# Patient Record
Sex: Female | Born: 1980 | Race: White | Hispanic: No | Smoking: Former smoker
Health system: Southern US, Community
[De-identification: ages and names within clinical notes are randomized; demographics above are authoritative.]

## PROBLEM LIST (undated history)

## (undated) DIAGNOSIS — IMO0002 Reserved for concepts with insufficient information to code with codable children: Secondary | ICD-10-CM

## (undated) DIAGNOSIS — M329 Systemic lupus erythematosus, unspecified: Secondary | ICD-10-CM

## (undated) HISTORY — PX: LUNG SURGERY: SHX703

## (undated) HISTORY — PX: OTHER SURGICAL HISTORY: SHX169

## (undated) HISTORY — DX: Systemic lupus erythematosus, unspecified: M32.9

## (undated) HISTORY — DX: Reserved for concepts with insufficient information to code with codable children: IMO0002

---

## 2003-02-11 ENCOUNTER — Inpatient Hospital Stay (HOSPITAL_COMMUNITY): Admission: AD | Admit: 2003-02-11 | Discharge: 2003-02-12 | Payer: Self-pay | Admitting: Obstetrics & Gynecology

## 2010-06-29 NOTE — Op Note (Signed)
Brittney White, Brittney White                              ACCOUNT NO.:  [id number]   MEDICAL RECORD NO.:  [id number]                   PATIENT TYPE:  INP   LOCATION:  LDR2                                 FACILITY:  [facility]   PHYSICIAN:  Lazaro Arms, M.D.                DATE OF BIRTH:  10-25-1980   DATE OF PROCEDURE:  02/11/2003  DATE OF DISCHARGE:                                 OPERATIVE REPORT   Mashonda progressed rapidly with just minimal Pitocin augmentation and developed  an urge to push at approximately 1320.  After a very brief second stage in  which the baby rotated in crowning position from ROP to OA position, she  delivered a viable female infant at 72.  Two loose nuchal cords were  reduced, and then the shoulders and body delivered without difficulty.  The  mouth and nose were suctioned on the perineum.  Apgars are 9 and 9, weight 6  pounds, 12 ounces.  Pitocin 20 units diluted in 1000 mL of lactated Ringer's  was infused rapidly IV.  The placenta separated spontaneously and delivered  by a controlled cord traction at 1331.  It was inspected and appears to be  intact with a three-vessel cord.  The fundus was immediately firm with  minimal blood loss noted.  The vagina was then inspected, and no lacerations  were found.  Estimated blood loss 300 mL.     ________________________________________  ___________________________________________  Jacklyn Shell, C.N.M.           Lazaro Arms, M.D.   FC/MEDQ  D:  02/11/2003  T:  02/11/2003  Job:  [id number]

## 2010-06-29 NOTE — H&P (Signed)
NAMEDANE, BLOCH                              ACCOUNT NO.:  [id number]   MEDICAL RECORD NO.:  [id number]                   PATIENT TYPE:  INP   LOCATION:  LDR2                                 FACILITY:  [facility]   PHYSICIAN:  Lazaro Arms, M.D.                DATE OF BIRTH:  04/01/1980   DATE OF ADMISSION:  02/11/2003  DATE OF DISCHARGE:                                HISTORY & PHYSICAL   CHIEF COMPLAINT:  Labor contractions since yesterday, becoming more regular  and intense today.   HISTORY OF PRESENT ILLNESS:  Keviana is a 30 year old gravida 3, para 1, Ab1,  with an EDC of February 21, 2003, based on a 20-week ultrasound.  Although  she states her last menstrual period was sure, the ultrasound was more than  a month off, so we assigned her an [facility] of February 21, 2003, placing her at  38-1/2 weeks' gestation.  She began prenatal care in her 24th week and has  had regular visits throughout.  Her prenatal course has been basically  uneventful.   Prenatal laboratories:  Blood type -- A positive, rubella is immune.  HBsAg,  HIV, RPR, gonorrhea, Chlamydia, GBS are all negative.  She had a one-hour  GTT that was normal at 71.   Blood pressures have been 120s to 140s over 60s to 80s.  She has had  appropriate fundal height growth with a drop in fundal height growth noted  the other day, but the patient that the baby had gotten much lower in her  pelvis, so that could probably account for the difference.   PAST MEDICAL HISTORY:  Noncontributory.   PAST SURGICAL HISTORY:  Past surgical history positive for tubes in her  ears, tonsillectomy and leg surgery.   ALLERGIES:  No known drug allergies.   SOCIAL HISTORY:  Is married, a housewife.   OBSTETRICAL HISTORY:  A second trimester miscarriage in 1995 and a  spontaneous vaginal delivery in the year 2000 at 40 weeks of a 7-pound 8-  ounce female with about a 45-minute labor.   PHYSICAL EXAMINATION:  HEENT:  Within normal limits.  LUNGS:   Lungs are clear.  ABDOMEN:  Abdomen soft, nontender, having mild-to-moderate contractions  every 3 minutes.  Fetal heart rate is in the 130s, reactive without  decelerations.  PELVIC:  Cervical exam upon admission was 4 cm, 100%, minus 1 station.  Cervical exam approximately 45 minutes to an hour later was 6 cm, 100%,  minus 1 to 0 station.  Artificial rupture of membranes revealed clear fluid.  EXTREMITIES:  Legs are negative.   IMPRESSION:  Intrauterine pregnancy at 38-1/2 weeks, active labor,  tolerating very well.   PLAN:  Expectant management.  Anticipate vaginal delivery, estimated fetal  weight about 7 pounds.     _____________________________________  ___________________________________________  Jacklyn Shell, C.N.M.        Amaryllis Dyke.  Despina Hidden, M.D.   FC/MEDQ  D:  02/11/2003  T:  02/11/2003  Job:  [id number]   cc:   [facility]

## 2013-08-03 ENCOUNTER — Encounter (HOSPITAL_COMMUNITY): Payer: Self-pay | Admitting: Emergency Medicine

## 2013-08-03 ENCOUNTER — Emergency Department (HOSPITAL_COMMUNITY)
Admission: EM | Admit: 2013-08-03 | Discharge: 2013-08-03 | Disposition: A | Discharge status: Home / Self Care | Payer: Medicaid Other | Attending: Emergency Medicine | Admitting: Emergency Medicine

## 2013-08-03 ENCOUNTER — Emergency Department (HOSPITAL_COMMUNITY): Payer: Medicaid Other

## 2013-08-03 DIAGNOSIS — Z3202 Encounter for pregnancy test, result negative: Secondary | ICD-10-CM | POA: Insufficient documentation

## 2013-08-03 DIAGNOSIS — N390 Urinary tract infection, site not specified: Secondary | ICD-10-CM | POA: Insufficient documentation

## 2013-08-03 DIAGNOSIS — R109 Unspecified abdominal pain: Secondary | ICD-10-CM | POA: Insufficient documentation

## 2013-08-03 DIAGNOSIS — F17200 Nicotine dependence, unspecified, uncomplicated: Secondary | ICD-10-CM | POA: Insufficient documentation

## 2013-08-03 DIAGNOSIS — R1030 Lower abdominal pain, unspecified: Secondary | ICD-10-CM

## 2013-08-03 LAB — URINALYSIS, ROUTINE W REFLEX MICROSCOPIC
Bilirubin Urine: NEGATIVE
Glucose, UA: NEGATIVE mg/dL
Ketones, ur: NEGATIVE mg/dL
Nitrite: NEGATIVE
Protein, ur: NEGATIVE mg/dL
Specific Gravity, Urine: 1.025 (ref 1.005–1.030)
Urobilinogen, UA: 0.2 mg/dL (ref 0.0–1.0)
pH: 6 (ref 5.0–8.0)

## 2013-08-03 LAB — BASIC METABOLIC PANEL
BUN: 13 mg/dL (ref 6–23)
CO2: 27 mEq/L (ref 19–32)
Calcium: 10 mg/dL (ref 8.4–10.5)
Chloride: 106 mEq/L (ref 96–112)
Creatinine, Ser: 0.79 mg/dL (ref 0.50–1.10)
GFR calc Af Amer: >90 mL/min (ref >90)
GFR calc non Af Amer: >90 mL/min (ref >90)
Glucose, Bld: 91 mg/dL (ref 70–99)
Potassium: 4.1 mEq/L (ref 3.7–5.3)
Sodium: 143 mEq/L (ref 137–147)

## 2013-08-03 LAB — CBC WITH DIFFERENTIAL/PLATELET
Basophils Absolute: 0 10*3/uL (ref 0.0–0.1)
Basophils Relative: 0 % (ref 0–1)
Eosinophils Absolute: 0.1 10*3/uL (ref 0.0–0.7)
Eosinophils Relative: 1 % (ref 0–5)
HCT: 40.7 % (ref 36.0–46.0)
Hemoglobin: 13.8 g/dL (ref 12.0–15.0)
Lymphocytes Relative: 30 % (ref 12–46)
Lymphs Abs: 4.1 10*3/uL — ABNORMAL HIGH (ref 0.7–4.0)
MCH: 30.3 pg (ref 26.0–34.0)
MCHC: 33.9 g/dL (ref 30.0–36.0)
MCV: 89.3 fL (ref 78.0–100.0)
Monocytes Absolute: 0.7 10*3/uL (ref 0.1–1.0)
Monocytes Relative: 5 % (ref 3–12)
Neutro Abs: 8.7 10*3/uL — ABNORMAL HIGH (ref 1.7–7.7)
Neutrophils Relative %: 64 % (ref 43–77)
Platelets: 252 10*3/uL (ref 150–400)
RBC: 4.56 MIL/uL (ref 3.87–5.11)
RDW: 14.5 % (ref 11.5–15.5)
WBC: 13.7 10*3/uL — ABNORMAL HIGH (ref 4.0–10.5)

## 2013-08-03 LAB — URINE MICROSCOPIC-ADD ON

## 2013-08-03 LAB — PREGNANCY, URINE: Preg Test, Ur: NEGATIVE

## 2013-08-03 MED ORDER — KETOROLAC TROMETHAMINE 30 MG/ML IJ SOLN
15.0000 mg | Freq: Once | INTRAMUSCULAR | Status: AC
  Administered 2013-08-03: 15 mg via INTRAVENOUS

## 2013-08-03 MED ORDER — DEXTROSE 5 % IV SOLN
1.0000 g | Freq: Once | INTRAVENOUS | Status: AC
  Administered 2013-08-03: 1 g via INTRAVENOUS

## 2013-08-03 MED ORDER — IOHEXOL 300 MG/ML  SOLN
50.0000 mL | Freq: Once | INTRAMUSCULAR | Status: AC | PRN
  Administered 2013-08-03: 50 mL via ORAL

## 2013-08-03 MED ORDER — SODIUM CHLORIDE 0.9 % IV BOLUS (SEPSIS)
1000.0000 mL | Freq: Once | INTRAVENOUS | Status: AC
  Administered 2013-08-03: 1000 mL via INTRAVENOUS

## 2013-08-03 MED ORDER — ONDANSETRON HCL 4 MG/2ML IJ SOLN
4.0000 mg | Freq: Once | INTRAMUSCULAR | Status: AC
  Administered 2013-08-03: 4 mg via INTRAVENOUS

## 2013-08-03 MED ORDER — ONDANSETRON HCL 4 MG/2ML IJ SOLN
INTRAMUSCULAR | Status: AC
  Filled 2013-08-03: qty 2

## 2013-08-03 MED ORDER — DEXTROSE 5 % IV SOLN
INTRAVENOUS | Status: AC
  Filled 2013-08-03: qty 10

## 2013-08-03 MED ORDER — OXYCODONE-ACETAMINOPHEN 5-325 MG PO TABS: 1.0000 | ORAL_TABLET | ORAL | Status: DC | PRN

## 2013-08-03 MED ORDER — METRONIDAZOLE 500 MG PO TABS: 500.0000 mg | ORAL_TABLET | Freq: Two times a day (BID) | ORAL | Status: DC

## 2013-08-03 MED ORDER — KETOROLAC TROMETHAMINE 30 MG/ML IJ SOLN
INTRAMUSCULAR | Status: AC
  Filled 2013-08-03: qty 1

## 2013-08-03 MED ORDER — CEPHALEXIN 500 MG PO CAPS: 500.0000 mg | ORAL_CAPSULE | Freq: Four times a day (QID) | ORAL | Status: DC

## 2013-08-03 MED ORDER — HYDROMORPHONE HCL PF 1 MG/ML IJ SOLN
1.0000 mg | Freq: Once | INTRAMUSCULAR | Status: AC
  Administered 2013-08-03: 1 mg via INTRAVENOUS

## 2013-08-03 MED ORDER — IOHEXOL 300 MG/ML  SOLN
100.0000 mL | Freq: Once | INTRAMUSCULAR | Status: AC | PRN
  Administered 2013-08-03: 100 mL via INTRAVENOUS

## 2013-08-03 MED ORDER — HYDROMORPHONE HCL PF 1 MG/ML IJ SOLN
INTRAMUSCULAR | Status: AC
  Filled 2013-08-03: qty 1

## 2013-08-03 NOTE — ED Notes (Signed)
Having cramps and pressure to back and lower abdomen (contant dullness).  Had a baby 3 months ago and obtained Mirena 2 months ago and it was rechecked one month ago.   Rating pain about 7 on pain scale.  Taken ibprofen 800 mg.  Having nausea and vomiting when pain is bad.

## 2013-08-03 NOTE — ED Notes (Signed)
Having blood in stools  from hemorrhoids.

## 2013-08-03 NOTE — ED Notes (Signed)
Pt c/o lower abdominal and pelvic pain that began this morning when she woke up. Pt states she had IUD place 1 month ago. Pt denies any vaginal bleeding or discharge.

## 2013-08-03 NOTE — Discharge Instructions (Signed)
Abdominal Pain °Many things can cause abdominal pain. Usually, abdominal pain is not caused by a disease and will improve without treatment. It can often be observed and treated at home. Your health care provider will do a physical exam and possibly order blood tests and X-rays to help determine the seriousness of your pain. However, in many cases, more time must pass before a clear cause of the pain can be found. Before that point, your health care provider may not know if you need more testing or further treatment. °HOME CARE INSTRUCTIONS  °Monitor your abdominal pain for any changes. The following actions may help to alleviate any discomfort you are experiencing: °· Only take over-the-counter or prescription medicines as directed by your health care provider. °· Do not take laxatives unless directed to do so by your health care provider. °· Try a clear liquid diet (broth, tea, or water) as directed by your health care provider. Slowly move to a bland diet as tolerated. °SEEK MEDICAL CARE IF: °· You have unexplained abdominal pain. °· You have abdominal pain associated with nausea or diarrhea. °· You have pain when you urinate or have a bowel movement. °· You experience abdominal pain that wakes you in the night. °· You have abdominal pain that is worsened or improved by eating food. °· You have abdominal pain that is worsened with eating fatty foods. °· You have a fever. °SEEK IMMEDIATE MEDICAL CARE IF:  °· Your pain does not go away within 2 hours. °· You keep throwing up (vomiting). °· Your pain is felt only in portions of the abdomen, such as the right side or the left lower portion of the abdomen. °· You pass bloody or black tarry stools. °MAKE SURE YOU: °· Understand these instructions.   °· Will watch your condition.   °· Will get help right away if you are not doing well or get worse.   °Document Released: 11/07/2004 Document Revised: 02/02/2013 Document Reviewed: 10/07/2012 °ExitCare® Patient Information  ©2015 [employer]. This information is not intended to replace advice given to you by your health care provider. Make sure you discuss any questions you have with your health care provider. ° °Urinary Tract Infection °A urinary tract infection (UTI) can occur any place along the urinary tract. The tract includes the kidneys, ureters, bladder, and urethra. A type of germ called bacteria often causes a UTI. UTIs are often helped with antibiotic medicine.  °HOME CARE  °· If given, take antibiotics as told by your doctor. Finish them even if you start to feel better. °· Drink enough fluids to keep your pee (urine) clear or pale yellow. °· Avoid tea, drinks with caffeine, and bubbly (carbonated) drinks. °· Pee often. Avoid holding your pee in for a long time. °· Pee before and after having sex (intercourse). °· Wipe from front to back after you poop (bowel movement) if you are a woman. Use each tissue only once. °GET HELP RIGHT AWAY IF:  °· You have back pain. °· You have lower belly (abdominal) pain. °· You have chills. °· You feel sick to your stomach (nauseous). °· You throw up (vomit). °· Your burning or discomfort with peeing does not go away. °· You have a fever. °· Your symptoms are not better in 3 days. °MAKE SURE YOU:  °· Understand these instructions. °· Will watch your condition. °· Will get help right away if you are not doing well or get worse. °Document Released: 07/17/2007 Document Revised: 10/23/2011 Document Reviewed: 08/29/2011 °ExitCare® Patient Information ©  2015 [employer]. This information is not intended to replace advice given to you by your health care provider. Make sure you discuss any questions you have with your health care provider.  Urinary Tract Infection A urinary tract infection (UTI) can occur any place along the urinary tract. The tract includes the kidneys, ureters, bladder, and urethra. A type of germ called bacteria often causes a UTI. UTIs are often helped with antibiotic  medicine.  HOME CARE   If given, take antibiotics as told by your doctor. Finish them even if you start to feel better.  Drink enough fluids to keep your pee (urine) clear or pale yellow.  Avoid tea, drinks with caffeine, and bubbly (carbonated) drinks.  Pee often. Avoid holding your pee in for a long time.  Pee before and after having sex (intercourse).  Wipe from front to back after you poop (bowel movement) if you are a woman. Use each tissue only once. GET HELP RIGHT AWAY IF:   You have back pain.  You have lower belly (abdominal) pain.  You have chills.  You feel sick to your stomach (nauseous).  You throw up (vomit).  Your burning or discomfort with peeing does not go away.  You have a fever.  Your symptoms are not better in 3 days. MAKE SURE YOU:   Understand these instructions.  Will watch your condition.  Will get help right away if you are not doing well or get worse. Document Released: 07/17/2007 Document Revised: 10/23/2011 Document Reviewed: 08/29/2011 [facility] Patient Information 2015 [place], [place]. This information is not intended to replace advice given to you by your health care provider. Make sure you discuss any questions you have with your health care provider.

## 2013-08-03 NOTE — ED Provider Notes (Signed)
CSN: [id number]     Arrival date & time 08/03/13  1609 History   First MD Initiated Contact with Patient 08/03/13 1630     Chief Complaint  Patient presents with  . Abdominal Pain     (Consider location/radiation/quality/duration/timing/severity/associated sxs/prior Treatment) HPI   33 year old female with abdominal pain. Pain is in the lower abdomen. Does not lateralize. Is "a dull hard pain." Sometimes cramping. No appreciable exacerbating relieving factors. Similar type pain in her lower back. No fevers or chills. Mild nausea and did vomit once and the pain was more severe. No urinary complaints. No unusual vaginal bleeding or discharge. Postpartum by approximately 3 months. Had an IUD placed one month ago.    History reviewed. No pertinent past medical history. Past Surgical History  Procedure Laterality Date  . Lung surgery     History reviewed. No pertinent family history. History  Substance Use Topics  . Smoking status: Current Every Day Smoker  . Smokeless tobacco: Not on file  . Alcohol Use: No   OB History   Grav Para Term Preterm Abortions TAB SAB Ect Mult Living                 Review of Systems  All systems reviewed and negative, other than as noted in HPI.   Allergies  Review of patient's allergies indicates no known allergies.  Home Medications   Prior to Admission medications   Not on File   BP 124/67  Pulse 79  Temp(Src) 98 F (36.7 C) (Oral)  Resp 20  Ht 5\' 2"  (1.575 m)  Wt 140 lb (63.504 kg)  BMI 25.60 kg/m2  SpO2 97% Physical Exam  Nursing note and vitals reviewed. Constitutional: She appears well-developed and well-nourished. No distress.  Sitting in bed. No acute distress.  HENT:  Head: Normocephalic and atraumatic.  Eyes: Conjunctivae are normal. Right eye exhibits no discharge. Left eye exhibits no discharge.  Neck: Neck supple.  Cardiovascular: Normal rate, regular rhythm and normal heart sounds.  Exam reveals no gallop and no  friction rub.   No murmur heard. Pulmonary/Chest: Effort normal and breath sounds normal. No respiratory distress.  Abdominal: Soft. She exhibits no distension. There is no tenderness.  Genitourinary:  No CVA tenderness  Musculoskeletal: She exhibits no edema and no tenderness.  Neurological: She is alert.  Skin: Skin is warm and dry. She is not diaphoretic.  Psychiatric: She has a normal mood and affect. Her behavior is normal. Thought content normal.    ED Course  Procedures (including critical care time) Labs Review Labs Reviewed  CBC WITH DIFFERENTIAL - Abnormal; Notable for the following:    WBC 13.7 (*)    Neutro Abs 8.7 (*)    Lymphs Abs 4.1 (*)    All other components within normal limits  URINALYSIS, ROUTINE W REFLEX MICROSCOPIC - Abnormal; Notable for the following:    Hgb urine dipstick TRACE (*)    Leukocytes, UA TRACE (*)    All other components within normal limits  URINE MICROSCOPIC-ADD ON - Abnormal; Notable for the following:    Squamous Epithelial / LPF FEW (*)    Bacteria, UA MANY (*)    All other components within normal limits  URINE CULTURE  BASIC METABOLIC PANEL  PREGNANCY, URINE    Imaging Review No results found.   EKG Interpretation None      MDM   Final diagnoses:  Lower abdominal pain  UTI (lower urinary tract infection)    33 year old female with  lower, pain. Her abdominal examination is benign. Urinalysis suggestive of infection. Started on antibiotics. We'll send a culture. Afebrile and hemodynamically stable. I feel she is appropriate for outpatient treatment. Return precautions were discussed.    Virgel Manifold, MD 08/08/13 1758

## 2013-08-04 LAB — URINE CULTURE: Colony Count: 50000

## 2014-03-03 ENCOUNTER — Emergency Department (HOSPITAL_COMMUNITY)
Admission: EM | Admit: 2014-03-03 | Discharge: 2014-03-03 | Disposition: A | Discharge status: Home / Self Care | Payer: Medicaid Other | Attending: Emergency Medicine | Admitting: Emergency Medicine

## 2014-03-03 ENCOUNTER — Encounter (HOSPITAL_COMMUNITY): Payer: Self-pay

## 2014-03-03 DIAGNOSIS — J029 Acute pharyngitis, unspecified: Secondary | ICD-10-CM | POA: Insufficient documentation

## 2014-03-03 DIAGNOSIS — R112 Nausea with vomiting, unspecified: Secondary | ICD-10-CM | POA: Insufficient documentation

## 2014-03-03 DIAGNOSIS — R197 Diarrhea, unspecified: Secondary | ICD-10-CM | POA: Insufficient documentation

## 2014-03-03 DIAGNOSIS — Z3202 Encounter for pregnancy test, result negative: Secondary | ICD-10-CM | POA: Insufficient documentation

## 2014-03-03 DIAGNOSIS — Z79899 Other long term (current) drug therapy: Secondary | ICD-10-CM | POA: Insufficient documentation

## 2014-03-03 DIAGNOSIS — Z720 Tobacco use: Secondary | ICD-10-CM | POA: Insufficient documentation

## 2014-03-03 DIAGNOSIS — Z792 Long term (current) use of antibiotics: Secondary | ICD-10-CM | POA: Insufficient documentation

## 2014-03-03 DIAGNOSIS — M791 Myalgia: Secondary | ICD-10-CM | POA: Insufficient documentation

## 2014-03-03 LAB — URINALYSIS W MICROSCOPIC (NOT AT ARMC)
Bilirubin Urine: NEGATIVE
Glucose, UA: NEGATIVE mg/dL
Ketones, ur: NEGATIVE mg/dL
Leukocytes, UA: NEGATIVE
Nitrite: NEGATIVE
Protein, ur: NEGATIVE mg/dL
Specific Gravity, Urine: >1.03 — ABNORMAL HIGH (ref 1.005–1.030)
Urobilinogen, UA: 0.2 mg/dL (ref 0.0–1.0)
pH: 5.5 (ref 5.0–8.0)

## 2014-03-03 LAB — PREGNANCY, URINE: Preg Test, Ur: NEGATIVE

## 2014-03-03 NOTE — ED Notes (Signed)
Pt reports vomiting, fever, and diarrhea since Monday.  Reports no vomiting since yesterday but still having some diarrhea.  Pt says her boss wants her to be cleared to return to work because she works in [employer].  Pt says feels better  But still weak.

## 2014-03-03 NOTE — ED Provider Notes (Signed)
CSN: [id number]     Arrival date & time 03/03/14  1841 History  This chart was scribed for Virgel Manifold, MD by Molli Posey, ED Scribe. This patient was seen in room APA09/APA09 and the patient's care was started 7:21 PM.    Chief Complaint  Patient presents with  . Emesis    The history is provided by the patient. No language interpreter was used.   HPI Comments: Hazel Chrissie Noa is a 34 y.o. female who presents to the Emergency Department complaining of vomiting for the last 4 days. She reports associated diarrhea, nausea, sore throat, intermittent fever, minimal abdominal soreness, weakness and myalgias as symptoms. Pt states her vomiting has been improving and she has not vomited today. She reports numerous members of her family are sick currently. Pt states her boss wants her to be cleared to return to work because she works in [employer]. She says she has been improving throughout the week. She reports no pertinent past medical history at this time. Pt denies any urinary symptoms.    History reviewed. No pertinent past medical history. Past Surgical History  Procedure Laterality Date  . Lung surgery    . Reconstructive surgery to ears     No family history on file. History  Substance Use Topics  . Smoking status: Current Every Day Smoker  . Smokeless tobacco: Not on file  . Alcohol Use: No   OB History    No data available     Review of Systems  Constitutional: Positive for fever.  HENT: Positive for sore throat.   Gastrointestinal: Positive for nausea, vomiting and diarrhea.  Musculoskeletal: Positive for myalgias.  All other systems reviewed and are negative.   Allergies  Other  Home Medications   Prior to Admission medications   Medication Sig Start Date End Date Taking? Authorizing Provider  cephALEXin (KEFLEX) 500 MG capsule Take 1 capsule (500 mg total) by mouth 4 (four) times daily. 08/03/13   Virgel Manifold, MD  ibuprofen (ADVIL,MOTRIN) 200 MG tablet Take  800 mg by mouth every 6 (six) hours as needed.    Historical Provider, MD  metroNIDAZOLE (FLAGYL) 500 MG tablet Take 1 tablet (500 mg total) by mouth 2 (two) times daily. 08/03/13   Virgel Manifold, MD  oxyCODONE-acetaminophen (PERCOCET/ROXICET) 5-325 MG per tablet Take 1-2 tablets by mouth every 4 (four) hours as needed for severe pain. 08/03/13   Virgel Manifold, MD   BP 137/59 mmHg  Pulse 99  Temp(Src) 99 F (37.2 C) (Oral)  Resp 20  Ht 5\' 2"  (1.575 m)  Wt 140 lb (63.504 kg)  BMI 25.60 kg/m2  SpO2 100%  LMP 02/05/2014 Physical Exam  Constitutional: She appears well-developed and well-nourished. No distress.  HENT:  Head: Normocephalic and atraumatic.  Right Ear: External ear normal.  Left Ear: External ear normal.  Eyes: Conjunctivae are normal. Right eye exhibits no discharge. Left eye exhibits no discharge. No scleral icterus.  Neck: Neck supple. No tracheal deviation present.  Cardiovascular: Normal rate, regular rhythm and intact distal pulses.   Pulmonary/Chest: Effort normal and breath sounds normal. No stridor. No respiratory distress. She has no wheezes. She has no rales.  Abdominal: Soft. Bowel sounds are normal. She exhibits no distension. There is no tenderness. There is no rebound and no guarding.  Musculoskeletal: She exhibits no edema or tenderness.  Neurological: She is alert. She has normal strength. No cranial nerve deficit (no facial droop, extraocular movements intact, no slurred speech) or sensory deficit. She exhibits  normal muscle tone. She displays no seizure activity. Coordination normal.  Skin: Skin is warm and dry. No rash noted.  Psychiatric: She has a normal mood and affect.  Nursing note and vitals reviewed.   ED Course  Procedures   DIAGNOSTIC STUDIES: Oxygen Saturation is 100% on RA, normal by my interpretation.    COORDINATION OF CARE: 7:25 PM Discussed treatment plan with pt at bedside and pt agreed to plan.   Labs Review Labs Reviewed   URINALYSIS W MICROSCOPIC  PREGNANCY, URINE    Imaging Review No results found.   EKG Interpretation None      MDM   Final diagnoses:  Nausea vomiting and diarrhea   34 year old female with nausea, vomiting diarrhea. Suspect viral illness. Symptoms have been improving. She is essentially requesting a return note for work. Provided. Abdominal exam is benign. Hand hygiene return precautions discussed.  I personally preformed the services scribed in my presence. The recorded information has been reviewed is accurate. Virgel Manifold, MD.      Virgel Manifold, MD 03/04/14 2059

## 2014-03-03 NOTE — ED Notes (Signed)
Patient verbalizes understanding of discharge instructions, home care and follow up care if needed. Patient ambulatory out of department at this time 

## 2014-03-03 NOTE — ED Notes (Signed)
Patient states NVD for the past week. Patient denies NVD today, just states stomach aches.

## 2015-05-03 ENCOUNTER — Emergency Department (HOSPITAL_COMMUNITY)
Admission: EM | Admit: 2015-05-03 | Discharge: 2015-05-03 | Disposition: A | Discharge status: Home / Self Care | Payer: Medicaid Other | Attending: Emergency Medicine | Admitting: Emergency Medicine

## 2015-05-03 ENCOUNTER — Encounter (HOSPITAL_COMMUNITY): Payer: Self-pay | Admitting: Emergency Medicine

## 2015-05-03 DIAGNOSIS — F17200 Nicotine dependence, unspecified, uncomplicated: Secondary | ICD-10-CM | POA: Insufficient documentation

## 2015-05-03 DIAGNOSIS — N390 Urinary tract infection, site not specified: Secondary | ICD-10-CM | POA: Insufficient documentation

## 2015-05-03 LAB — CBC WITH DIFFERENTIAL/PLATELET
BASOS ABS: 0.1 10*3/uL (ref 0.0–0.1)
BASOS PCT: 0 %
EOS ABS: 0.2 10*3/uL (ref 0.0–0.7)
EOS PCT: 1 %
HCT: 44 % (ref 36.0–46.0)
Hemoglobin: 15.2 g/dL — ABNORMAL HIGH (ref 12.0–15.0)
Lymphocytes Relative: 34 %
Lymphs Abs: 5.7 10*3/uL — ABNORMAL HIGH (ref 0.7–4.0)
MCH: 31.8 pg (ref 26.0–34.0)
MCHC: 34.5 g/dL (ref 30.0–36.0)
MCV: 92.1 fL (ref 78.0–100.0)
MONO ABS: 0.9 10*3/uL (ref 0.1–1.0)
Monocytes Relative: 5 %
Neutro Abs: 10 10*3/uL — ABNORMAL HIGH (ref 1.7–7.7)
Neutrophils Relative %: 60 %
PLATELETS: 276 10*3/uL (ref 150–400)
RBC: 4.78 MIL/uL (ref 3.87–5.11)
RDW: 13.3 % (ref 11.5–15.5)
WBC: 16.9 10*3/uL — ABNORMAL HIGH (ref 4.0–10.5)

## 2015-05-03 LAB — URINALYSIS, ROUTINE W REFLEX MICROSCOPIC
GLUCOSE, UA: NEGATIVE mg/dL
HGB URINE DIPSTICK: NEGATIVE
KETONES UR: NEGATIVE mg/dL
Nitrite: NEGATIVE
PROTEIN: NEGATIVE mg/dL
Specific Gravity, Urine: 1.01 (ref 1.005–1.030)
pH: 6.5 (ref 5.0–8.0)

## 2015-05-03 LAB — BASIC METABOLIC PANEL
ANION GAP: 6 (ref 5–15)
BUN: 12 mg/dL (ref 6–20)
CALCIUM: 9.4 mg/dL (ref 8.9–10.3)
CO2: 30 mmol/L (ref 22–32)
Chloride: 104 mmol/L (ref 101–111)
Creatinine, Ser: 0.7 mg/dL (ref 0.44–1.00)
GFR calc Af Amer: >60 mL/min (ref >60)
GLUCOSE: 88 mg/dL (ref 65–99)
Potassium: 3.9 mmol/L (ref 3.5–5.1)
Sodium: 140 mmol/L (ref 135–145)

## 2015-05-03 LAB — URINE MICROSCOPIC-ADD ON

## 2015-05-03 MED ORDER — CEPHALEXIN 500 MG PO CAPS: 500.0000 mg | ORAL_CAPSULE | Freq: Four times a day (QID) | ORAL | Status: DC

## 2015-05-03 MED ORDER — ONDANSETRON HCL 4 MG PO TABS
4.0000 mg | ORAL_TABLET | Freq: Once | ORAL | Status: AC
  Administered 2015-05-03: 4 mg via ORAL
  Filled 2015-05-03: qty 1

## 2015-05-03 MED ORDER — LIDOCAINE HCL (PF) 1 % IJ SOLN
INTRAMUSCULAR | Status: AC
  Filled 2015-05-03: qty 5

## 2015-05-03 MED ORDER — CEFTRIAXONE SODIUM 1 G IJ SOLR
1.0000 g | Freq: Once | INTRAMUSCULAR | Status: AC
  Administered 2015-05-03: 1 g via INTRAMUSCULAR
  Filled 2015-05-03: qty 10

## 2015-05-03 MED ORDER — HYDROCODONE-ACETAMINOPHEN 5-325 MG PO TABS
1.0000 | ORAL_TABLET | Freq: Once | ORAL | Status: AC
  Administered 2015-05-03: 1 via ORAL
  Filled 2015-05-03: qty 1

## 2015-05-03 NOTE — ED Notes (Signed)
Pregnancy POC : NEGATIVE

## 2015-05-03 NOTE — ED Provider Notes (Signed)
CSN: [id number]     Arrival date & time 05/03/15  1911 History   First MD Initiated Contact with Patient 05/03/15 2001     Chief Complaint  Patient presents with  . Dysuria     (Consider location/radiation/quality/duration/timing/severity/associated sxs/prior Treatment) Patient is a 35 y.o. female presenting with dysuria. The history is provided by the patient.  Dysuria Pain quality:  Aching Pain severity:  Moderate Onset quality:  Gradual Duration:  2 weeks Timing:  Intermittent Progression:  Worsening Chronicity:  New Relieved by:  Nothing Ineffective treatments:  None tried Urinary symptoms: foul-smelling urine, frequent urination and incontinence   Associated symptoms: no fever, no flank pain, no vaginal discharge and no vomiting   Risk factors: no hx of pyelonephritis     History reviewed. No pertinent past medical history. Past Surgical History  Procedure Laterality Date  . Lung surgery    . Reconstructive surgery to ears     History reviewed. No pertinent family history. Social History  Substance Use Topics  . Smoking status: Current Every Day Smoker  . Smokeless tobacco: None  . Alcohol Use: No   OB History    No data available     Review of Systems  Constitutional: Negative for fever.  Gastrointestinal: Negative for vomiting.  Genitourinary: Positive for dysuria. Negative for flank pain and vaginal discharge.  All other systems reviewed and are negative.     Allergies  Other  Home Medications   Prior to Admission medications   Medication Sig Start Date End Date Taking? Authorizing Provider  cephALEXin (KEFLEX) 500 MG capsule Take 1 capsule (500 mg total) by mouth 4 (four) times daily. 08/03/13   Virgel Manifold, MD  ibuprofen (ADVIL,MOTRIN) 200 MG tablet Take 800 mg by mouth every 6 (six) hours as needed.    Historical Provider, MD  metroNIDAZOLE (FLAGYL) 500 MG tablet Take 1 tablet (500 mg total) by mouth 2 (two) times daily. 08/03/13   Virgel Manifold, MD  oxyCODONE-acetaminophen (PERCOCET/ROXICET) 5-325 MG per tablet Take 1-2 tablets by mouth every 4 (four) hours as needed for severe pain. 08/03/13   Virgel Manifold, MD   BP 109/64 mmHg  Pulse 68  Temp(Src) 98.4 F (36.9 C) (Oral)  Resp 16  Ht 5\' 2"  (1.575 m)  Wt 72.576 kg  BMI 29.26 kg/m2  SpO2 99% Physical Exam  Constitutional: She is oriented to person, place, and time. She appears well-developed and well-nourished.  Non-toxic appearance.  HENT:  Head: Normocephalic.  Right Ear: Tympanic membrane and external ear normal.  Left Ear: Tympanic membrane and external ear normal.  Eyes: EOM and lids are normal. Pupils are equal, round, and reactive to light.  Neck: Normal range of motion. Neck supple. Carotid bruit is not present.  Cardiovascular: Normal rate, regular rhythm, normal heart sounds, intact distal pulses and normal pulses.   Pulmonary/Chest: Breath sounds normal. No respiratory distress.  Abdominal: Soft. Bowel sounds are normal. There is tenderness in the suprapubic area. There is no guarding.  Musculoskeletal: Normal range of motion.  Lymphadenopathy:       Head (right side): No submandibular adenopathy present.       Head (left side): No submandibular adenopathy present.    She has no cervical adenopathy.  Neurological: She is alert and oriented to person, place, and time. She has normal strength. No cranial nerve deficit or sensory deficit.  Skin: Skin is warm and dry.  Psychiatric: She has a normal mood and affect. Her speech is normal.  Nursing  note and vitals reviewed.   ED Course  Procedures (including critical care time) Labs Review Labs Reviewed  URINALYSIS, ROUTINE W REFLEX MICROSCOPIC (NOT AT [facility]) - Abnormal; Notable for the following:    Color, Urine STRAW (*)    APPearance CLOUDY (*)    Bilirubin Urine SMALL (*)    Leukocytes, UA LARGE (*)    All other components within normal limits  CBC WITH DIFFERENTIAL/PLATELET - Abnormal; Notable for  the following:    WBC 16.9 (*)    Hemoglobin 15.2 (*)    Neutro Abs 10.0 (*)    Lymphs Abs 5.7 (*)    All other components within normal limits  URINE MICROSCOPIC-ADD ON - Abnormal; Notable for the following:    Squamous Epithelial / LPF 0-5 (*)    Bacteria, UA MANY (*)    All other components within normal limits  BASIC METABOLIC PANEL  POC URINE PREG, ED    Imaging Review No results found. I have personally reviewed and evaluated these images and lab results as part of my medical decision-making.   EKG Interpretation None      MDM  Labs suggest UTI. No temp elevation. No n/v. No back pain. Pt treated in ED with IM Rocephin in the ED. Rx for Keflex given. Pt has AZO at home. Discussed the need for increase fluids, and to return to the ED if any changes or problem.   Final diagnoses:  UTI (lower urinary tract infection)    **I have reviewed nursing notes, vital signs, and all appropriate lab and imaging results for this patient.    Lily Kocher, PA-C 05/04/15 [place], MD 05/06/15 1101

## 2015-05-03 NOTE — ED Notes (Signed)
Pt c/o urinary frequency and dysuria x 2 weeks.

## 2015-05-03 NOTE — Discharge Instructions (Signed)
Please increase fluids. Please use Keflex 4 times daily for the next 5 days. Take medication with food. May use the AZO for urinary discomfort if needed. Please have your urine rechecked in 10 days.  Urinary Tract Infection Urinary tract infections (UTIs) can develop anywhere along your urinary tract. Your urinary tract is your body's drainage system for removing wastes and extra water. Your urinary tract includes two kidneys, two ureters, a bladder, and a urethra. Your kidneys are a pair of bean-shaped organs. Each kidney is about the size of your fist. They are located below your ribs, one on each side of your spine. CAUSES Infections are caused by microbes, which are microscopic organisms, including fungi, viruses, and bacteria. These organisms are so small that they can only be seen through a microscope. Bacteria are the microbes that most commonly cause UTIs. SYMPTOMS  Symptoms of UTIs may vary by age and gender of the patient and by the location of the infection. Symptoms in young women typically include a frequent and intense urge to urinate and a painful, burning feeling in the bladder or urethra during urination. Older women and men are more likely to be tired, shaky, and weak and have muscle aches and abdominal pain. A fever may mean the infection is in your kidneys. Other symptoms of a kidney infection include pain in your back or sides below the ribs, nausea, and vomiting. DIAGNOSIS To diagnose a UTI, your caregiver will ask you about your symptoms. Your caregiver will also ask you to provide a urine sample. The urine sample will be tested for bacteria and white blood cells. White blood cells are made by your body to help fight infection. TREATMENT  Typically, UTIs can be treated with medication. Because most UTIs are caused by a bacterial infection, they usually can be treated with the use of antibiotics. The choice of antibiotic and length of treatment depend on your symptoms and the type of  bacteria causing your infection. HOME CARE INSTRUCTIONS  If you were prescribed antibiotics, take them exactly as your caregiver instructs you. Finish the medication even if you feel better after you have only taken some of the medication.  Drink enough water and fluids to keep your urine clear or pale yellow.  Avoid caffeine, tea, and carbonated beverages. They tend to irritate your bladder.  Empty your bladder often. Avoid holding urine for long periods of time.  Empty your bladder before and after sexual intercourse.  After a bowel movement, women should cleanse from front to back. Use each tissue only once. SEEK MEDICAL CARE IF:   You have back pain.  You develop a fever.  Your symptoms do not begin to resolve within 3 days. SEEK IMMEDIATE MEDICAL CARE IF:   You have severe back pain or lower abdominal pain.  You develop chills.  You have nausea or vomiting.  You have continued burning or discomfort with urination. MAKE SURE YOU:   Understand these instructions.  Will watch your condition.  Will get help right away if you are not doing well or get worse.   This information is not intended to replace advice given to you by your health care provider. Make sure you discuss any questions you have with your health care provider.   Document Released: 11/07/2004 Document Revised: 10/19/2014 Document Reviewed: 03/08/2011 Elsevier Interactive Patient Education [employer].

## 2015-05-06 LAB — URINE CULTURE
Culture: >=100000
Special Requests: NORMAL

## 2015-05-07 ENCOUNTER — Telehealth: Payer: Self-pay | Admitting: *Deleted

## 2015-05-07 NOTE — ED Notes (Signed)
(+)  urine culture, reviewed by Marval Regal, PA treated with Cephalexin, no changes needed

## 2018-07-02 DIAGNOSIS — Z6829 Body mass index (BMI) 29.0-29.9, adult: Secondary | ICD-10-CM | POA: Diagnosis not present

## 2018-07-02 DIAGNOSIS — Z01419 Encounter for gynecological examination (general) (routine) without abnormal findings: Secondary | ICD-10-CM | POA: Diagnosis not present

## 2018-07-03 DIAGNOSIS — Z01419 Encounter for gynecological examination (general) (routine) without abnormal findings: Secondary | ICD-10-CM | POA: Diagnosis not present

## 2018-07-13 DIAGNOSIS — Z30430 Encounter for insertion of intrauterine contraceptive device: Secondary | ICD-10-CM | POA: Diagnosis not present

## 2018-07-28 DIAGNOSIS — Z30433 Encounter for removal and reinsertion of intrauterine contraceptive device: Secondary | ICD-10-CM | POA: Diagnosis not present

## 2018-07-28 DIAGNOSIS — Z3202 Encounter for pregnancy test, result negative: Secondary | ICD-10-CM | POA: Diagnosis not present

## 2019-03-22 DIAGNOSIS — Z6830 Body mass index (BMI) 30.0-30.9, adult: Secondary | ICD-10-CM | POA: Diagnosis not present

## 2019-03-22 DIAGNOSIS — E6609 Other obesity due to excess calories: Secondary | ICD-10-CM | POA: Diagnosis not present

## 2019-03-22 DIAGNOSIS — Z0001 Encounter for general adult medical examination with abnormal findings: Secondary | ICD-10-CM | POA: Diagnosis not present

## 2019-03-22 DIAGNOSIS — Z1389 Encounter for screening for other disorder: Secondary | ICD-10-CM | POA: Diagnosis not present

## 2019-03-22 DIAGNOSIS — L659 Nonscarring hair loss, unspecified: Secondary | ICD-10-CM | POA: Diagnosis not present

## 2019-03-22 DIAGNOSIS — Z0000 Encounter for general adult medical examination without abnormal findings: Secondary | ICD-10-CM | POA: Diagnosis not present

## 2019-04-01 ENCOUNTER — Ambulatory Visit (HOSPITAL_COMMUNITY): Payer: BC Managed Care – PPO | Admitting: Hematology

## 2019-04-05 ENCOUNTER — Encounter (HOSPITAL_COMMUNITY): Payer: Self-pay | Admitting: *Deleted

## 2019-04-06 ENCOUNTER — Inpatient Hospital Stay (HOSPITAL_COMMUNITY): Payer: BC Managed Care – PPO

## 2019-04-06 ENCOUNTER — Inpatient Hospital Stay (HOSPITAL_COMMUNITY): Payer: BC Managed Care – PPO | Attending: Hematology | Admitting: Hematology

## 2019-04-06 ENCOUNTER — Encounter (HOSPITAL_COMMUNITY): Payer: Self-pay | Admitting: Hematology

## 2019-04-06 ENCOUNTER — Other Ambulatory Visit: Payer: Self-pay

## 2019-04-06 VITALS — BP 107/75 | HR 76 | Temp 97.3°F | Resp 18 | Ht 63.0 in | Wt 167.8 lb

## 2019-04-06 DIAGNOSIS — D72829 Elevated white blood cell count, unspecified: Secondary | ICD-10-CM | POA: Insufficient documentation

## 2019-04-06 DIAGNOSIS — K625 Hemorrhage of anus and rectum: Secondary | ICD-10-CM | POA: Diagnosis not present

## 2019-04-06 DIAGNOSIS — Z803 Family history of malignant neoplasm of breast: Secondary | ICD-10-CM | POA: Insufficient documentation

## 2019-04-06 DIAGNOSIS — Z801 Family history of malignant neoplasm of trachea, bronchus and lung: Secondary | ICD-10-CM | POA: Insufficient documentation

## 2019-04-06 DIAGNOSIS — F17210 Nicotine dependence, cigarettes, uncomplicated: Secondary | ICD-10-CM | POA: Insufficient documentation

## 2019-04-06 LAB — CBC WITH DIFFERENTIAL/PLATELET
Abs Immature Granulocytes: 0.06 10*3/uL (ref 0.00–0.07)
Basophils Absolute: 0.1 10*3/uL (ref 0.0–0.1)
Basophils Relative: 1 %
Eosinophils Absolute: 0.2 10*3/uL (ref 0.0–0.5)
Eosinophils Relative: 1 %
HCT: 44.9 % (ref 36.0–46.0)
Hemoglobin: 15.1 g/dL — ABNORMAL HIGH (ref 12.0–15.0)
Immature Granulocytes: 0 %
Lymphocytes Relative: 33 %
Lymphs Abs: 4.8 10*3/uL — ABNORMAL HIGH (ref 0.7–4.0)
MCH: 31.4 pg (ref 26.0–34.0)
MCHC: 33.6 g/dL (ref 30.0–36.0)
MCV: 93.3 fL (ref 80.0–100.0)
Monocytes Absolute: 0.7 10*3/uL (ref 0.1–1.0)
Monocytes Relative: 5 %
Neutro Abs: 8.9 10*3/uL — ABNORMAL HIGH (ref 1.7–7.7)
Neutrophils Relative %: 60 %
Platelets: 297 10*3/uL (ref 150–400)
RBC: 4.81 MIL/uL (ref 3.87–5.11)
RDW: 13 % (ref 11.5–15.5)
WBC: 14.7 10*3/uL — ABNORMAL HIGH (ref 4.0–10.5)
nRBC: 0 % (ref 0.0–0.2)

## 2019-04-06 LAB — LACTATE DEHYDROGENASE: LDH: 130 U/L (ref 98–192)

## 2019-04-06 LAB — RETICULOCYTES
Immature Retic Fract: 4.6 % (ref 2.3–15.9)
RBC.: 4.78 MIL/uL (ref 3.87–5.11)
Retic Count, Absolute: 54 10*3/uL (ref 19.0–186.0)
Retic Ct Pct: 1.1 % (ref 0.4–3.1)

## 2019-04-06 LAB — C-REACTIVE PROTEIN: CRP: 0.5 mg/dL (ref <1.0)

## 2019-04-06 LAB — SEDIMENTATION RATE: Sed Rate: 2 mm/hr (ref 0–22)

## 2019-04-06 NOTE — Patient Instructions (Signed)
[place] at [facility] Discharge Instructions  You were seen today by Dr. Delton Coombes. He went over your history, family history and how you've been feeling lately. He will refer you to GI for evaluation. You will have blood drawn today before you leave the hospital. He will see you back in 3 weeks for labs and follow up.   Thank you for choosing [place] at [facility] to provide your oncology and hematology care.  To afford each patient quality time with our provider, please arrive at least 15 minutes before your scheduled appointment time.   If you have a lab appointment with the [facility] please come in thru the  Main Entrance and check in at the main information desk  You need to re-schedule your appointment should you arrive 10 or more minutes late.  We strive to give you quality time with our providers, and arriving late affects you and other patients whose appointments are after yours.  Also, if you no show three or more times for appointments you may be dismissed from the clinic at the providers discretion.     Again, thank you for choosing [facility].  Our hope is that these requests will decrease the amount of time that you wait before being seen by our physicians.       _____________________________________________________________  Should you have questions after your visit to [facility], please contact our office at [phone] between the hours of 8:00 a.m. and 4:30 p.m.  Voicemails left after 4:00 p.m. will not be returned until the following business day.  For prescription refill requests, have your pharmacy contact our office and allow 72 hours.    Cancer Center Support Programs:   > Cancer Support Group  2nd Tuesday of the month 1pm-2pm, Journey Room

## 2019-04-06 NOTE — Progress Notes (Signed)
CONSULT NOTE  Patient Care Team: Ginger Organ as PCP - General (Physician Assistant)  CHIEF COMPLAINTS/PURPOSE OF CONSULTATION:  Leukocytosis.  HISTORY OF PRESENTING ILLNESS:  Brittney White 39 y.o. female is seen in consultation today at the request of Collene Mares, PA-C for further evaluation and management of leukocytosis.  CBC on 03/22/2019 shows white count 18.8.  Absolute neutrophil count was elevated at 13.7 and absolute lymphocyte count elevated at 3.7.  Monocytes, eosinophils and basophils were normal.  Hemoglobin and platelet count was also normal range.  She denies any recent infections.  She is not on any systemic steroids or topical steroids.  Denies any prior history of splenectomy.  Denies any personal history of connective tissue disorders.  No joint swellings or pains.  She reports some pain in the shins on and off.  No skin rashes reported.  She is current active smoker, smokes half pack per day of cigarettes for the past 20 years.  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  She denies any exposure to chemicals other than cleaning liquids.  She reported that she had bleeding per rectum in the last 2 weeks more profuse than before.  Bleeding is present with and without stools.  She denies any history of constipation or change in bowel habits.  Patient reports history of hemorrhoids since her first pregnancy but the bleeding is much less and is on and off.  She usually notices it on the tissue paper.  Family history significant for paternal grandfather with lymphoma.  Paternal grandmother had breast cancer and paternal aunt had ovarian cancer.  Maternal grandmother had lung cancer and was never smoker.  She denies any fevers, night sweats or weight loss in the last 6 months.  Appetite and energy levels are 100%.   MEDICAL HISTORY:  History reviewed. No pertinent past medical history.  SURGICAL HISTORY: Past Surgical History:  Procedure Laterality Date  . LUNG  SURGERY    . reconstructive surgery to ears      SOCIAL HISTORY: Social History   Socioeconomic History  . Marital status: Legally Separated    Spouse name: Not on file  . Number of children: 3  . Years of education: Not on file  . Highest education level: Not on file  Occupational History  . Not on file  Tobacco Use  . Smoking status: Current Every Day Smoker  Substance and Sexual Activity  . Alcohol use: No  . Drug use: Yes    Types: Marijuana  . Sexual activity: Not on file  Other Topics Concern  . Not on file  Social History Narrative  . Not on file   Social Determinants of Health   Financial Resource Strain:   . Difficulty of Paying Living Expenses: Not on file  Food Insecurity:   . Worried About Charity fundraiser in the Last Year: Not on file  . Ran Out of Food in the Last Year: Not on file  Transportation Needs:   . Lack of Transportation (Medical): Not on file  . Lack of Transportation (Non-Medical): Not on file  Physical Activity:   . Days of Exercise per Week: Not on file  . Minutes of Exercise per Session: Not on file  Stress:   . Feeling of Stress : Not on file  Social Connections:   . Frequency of Communication with Friends and Family: Not on file  . Frequency of Social Gatherings with Friends and Family: Not on file  . Attends  Religious Services: Not on file  . Active Member of Clubs or Organizations: Not on file  . Attends Archivist Meetings: Not on file  . Marital Status: Not on file  Intimate Partner Violence:   . Fear of Current or Ex-Partner: Not on file  . Emotionally Abused: Not on file  . Physically Abused: Not on file  . Sexually Abused: Not on file    FAMILY HISTORY: Family History  Problem Relation Age of Onset  . Cancer Maternal Grandmother   . COPD Paternal Grandmother   . Emphysema Paternal Grandmother   . Cancer Paternal Grandfather   . Healthy Daughter   . Healthy Daughter   . Healthy Son     ALLERGIES:  is  allergic to other.  MEDICATIONS:  No current outpatient medications on file.   No current facility-administered medications for this visit.    REVIEW OF SYSTEMS:   Constitutional: Denies fevers, chills or abnormal night sweats Eyes: Denies blurriness of vision, double vision or watery eyes Ears, nose, mouth, throat, and face: Denies mucositis or sore throat Respiratory: Denies cough, dyspnea or wheezes Cardiovascular: Denies palpitation, chest discomfort or lower extremity swelling Gastrointestinal: Positive for bleeding per rectum.  No change in bowel habits. Skin: Denies abnormal skin rashes Lymphatics: Denies new lymphadenopathy or easy bruising Neurological:Denies numbness, tingling or new weaknesses Behavioral/Psych: Mood is stable, no new changes  All other systems were reviewed with the patient and are negative.  PHYSICAL EXAMINATION: ECOG PERFORMANCE STATUS: 0 - Asymptomatic  Vitals:   04/06/19 1420  BP: 107/75  Pulse: 76  Resp: 18  Temp: (!) 97.3 F (36.3 C)  SpO2: 96%   Filed Weights   04/06/19 1420  Weight: 167 lb 12.8 oz (76.1 kg)    GENERAL:alert, no distress and comfortable SKIN: skin color, texture, turgor are normal, no rashes or significant lesions EYES: normal, conjunctiva are pink and non-injected, sclera clear OROPHARYNX:no exudate, no erythema and lips, buccal mucosa, and tongue normal  NECK: supple, thyroid normal size, non-tender, without nodularity LYMPH:  no palpable lymphadenopathy in the cervical, axillary or inguinal LUNGS: clear to auscultation and percussion with normal breathing effort HEART: regular rate & rhythm and no murmurs and no lower extremity edema ABDOMEN:abdomen soft, non-tender and normal bowel sounds Musculoskeletal:no cyanosis of digits and no clubbing  PSYCH: alert & oriented x 3 with fluent speech NEURO: no focal motor/sensory deficits  LABORATORY DATA:  I have reviewed the data as listed No results found for this or  any previous visit (from the past 2160 hour(s)).  RADIOGRAPHIC STUDIES: I have personally reviewed the radiological images as listed and agreed with the findings in the report.  ASSESSMENT & PLAN:  Leukocytosis 1.  Leukocytosis: -CBC on 03/22/2019 at [facility] showed white count of 18.8 with elevated absolute neutrophil count and absolute lymphocyte count.  Hemoglobin and platelets were normal. -She denies any recent infections.  Denies any systemic steroid use.  No prior history of splenectomy.  No personal history of connective tissue disorders. -She is current active smoker, smokes half pack per day of cigarettes for the past 20 years. -Denies any joint pains or skin rashes. -I have reviewed her CBC from past several years.  She had elevated white count in 2017 and 2015 and was diagnosed with UTI at both occasions.  She does not have any symptoms of urinary infection at this time. -CT abdomen and pelvis from 2015 shows normal spleen. -We discussed various differential diagnosis.  We will repeat her  CBC and check her LDH.  We will also check ESR/CRP, ANA and rheumatoid factor.  We will evaluate her smear. -We will rule out myeloproliferative disorders by checking BCR/ABL by FISH and JAK2 mutations.  We will also send blood for flow cytometry. 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  We will see her back in 3 weeks to discuss results.  2.  Bleeding per rectum: -She reports bleeding per rectum even without bowel movement in the last 2 weeks.  She reportedly had a history of hemorrhoids with very minimal bleeding, with spotting on the tissue for the past several years since her pregnancies on and off. -We will make referral to GI for further evaluation.  3.  Family history: Paternal grandfather had lymphoma.  Paternal grandmother had breast cancer and paternal aunt had ovarian cancer. -Maternal grandmother had lung cancer and was never  smoker.     All questions were answered. The patient knows to call the clinic with any problems, questions or concerns.      Derek Jack, MD 04/06/19 2:51 PM

## 2019-04-06 NOTE — Assessment & Plan Note (Addendum)
1.  Leukocytosis: -CBC on 03/22/2019 at [facility] showed white count of 18.8 with elevated absolute neutrophil count and absolute lymphocyte count.  Hemoglobin and platelets were normal. -She denies any recent infections.  Denies any systemic steroid use.  No prior history of splenectomy.  No personal history of connective tissue disorders. -She is current active smoker, smokes half pack per day of cigarettes for the past 20 years. -Denies any joint pains or skin rashes. -I have reviewed her CBC from past several years.  She had elevated white count in 2017 and 2015 and was diagnosed with UTI at both occasions.  She does not have any symptoms of urinary infection at this time. -CT abdomen and pelvis from 2015 shows normal spleen. -We discussed various differential diagnosis.  We will repeat her CBC and check her LDH.  We will also check ESR/CRP, ANA and rheumatoid factor.  We will evaluate her smear. -We will rule out myeloproliferative disorders by checking BCR/ABL by FISH and JAK2 mutations.  We will also send blood for flow cytometry. 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  We will see her back in 3 weeks to discuss results.  2.  Bleeding per rectum: -She reports bleeding per rectum even without bowel movement in the last 2 weeks.  She reportedly had a history of hemorrhoids with very minimal bleeding, with spotting on the tissue for the past several years since her pregnancies on and off. -We will make referral to GI for further evaluation.  3.  Family history: Paternal grandfather had lymphoma.  Paternal grandmother had breast cancer and paternal aunt had ovarian cancer. -Maternal grandmother had lung cancer and was never smoker.

## 2019-04-07 LAB — FANA STAINING PATTERNS: Homogeneous Pattern: 1:80 {titer}

## 2019-04-07 LAB — PATHOLOGIST SMEAR REVIEW

## 2019-04-07 LAB — SURGICAL PATHOLOGY

## 2019-04-07 LAB — ANTINUCLEAR ANTIBODIES, IFA: ANA Ab, IFA: POSITIVE — AB

## 2019-04-07 LAB — RHEUMATOID FACTOR: Rheumatoid fact SerPl-aCnc: <10 IU/mL (ref 0.0–13.9)

## 2019-04-12 LAB — BCR-ABL1 FISH
Cells Analyzed: 200
Cells Counted: 200

## 2019-04-13 ENCOUNTER — Encounter: Payer: Self-pay | Admitting: Internal Medicine

## 2019-04-13 LAB — JAK2 V617F, W REFLEX TO CALR/E12/MPL

## 2019-04-13 LAB — CALR + JAK2 E12-15 + MPL (REFLEXED)

## 2019-04-28 ENCOUNTER — Encounter (HOSPITAL_COMMUNITY): Payer: Self-pay | Admitting: Hematology

## 2019-04-28 ENCOUNTER — Other Ambulatory Visit: Payer: Self-pay

## 2019-04-28 ENCOUNTER — Inpatient Hospital Stay (HOSPITAL_COMMUNITY): Payer: BC Managed Care – PPO | Attending: Hematology | Admitting: Hematology

## 2019-04-28 VITALS — BP 111/53 | HR 75 | Temp 97.8°F | Resp 18 | Wt 168.8 lb

## 2019-04-28 DIAGNOSIS — F17210 Nicotine dependence, cigarettes, uncomplicated: Secondary | ICD-10-CM | POA: Diagnosis not present

## 2019-04-28 DIAGNOSIS — K625 Hemorrhage of anus and rectum: Secondary | ICD-10-CM | POA: Insufficient documentation

## 2019-04-28 DIAGNOSIS — D72829 Elevated white blood cell count, unspecified: Secondary | ICD-10-CM | POA: Insufficient documentation

## 2019-04-28 MED ORDER — CHANTIX STARTING MONTH PAK 0.5 MG X 11 & 1 MG X 42 PO TABS: ORAL_TABLET | ORAL | 0 refills | Status: DC

## 2019-04-28 NOTE — Progress Notes (Signed)
[address] [place], [place] [address]   CLINIC:  Medical Oncology/Hematology  PCP:  Cory Munch, PA-C [place] [address] [phone]   REASON FOR VISIT:  Follow-up for leukocytosis.  CURRENT THERAPY: None.   INTERVAL HISTORY:  Brittney White 39 y.o. female seen for follow-up of elevated white count.  Denies any fevers, night sweats or weight loss.  Appetite and energy levels are 100%.  Reports occasional stiffness in the small joints of the hands without any swelling.  Denies any skin rashes.    REVIEW OF SYSTEMS:  Review of Systems  All other systems reviewed and are negative.    PAST MEDICAL/SURGICAL HISTORY:  History reviewed. No pertinent past medical history. Past Surgical History:  Procedure Laterality Date  . LUNG SURGERY    . reconstructive surgery to ears       SOCIAL HISTORY:  Social History   Socioeconomic History  . Marital status: Legally Separated    Spouse name: Not on file  . Number of children: 3  . Years of education: Not on file  . Highest education level: Not on file  Occupational History  . Not on file  Tobacco Use  . Smoking status: Current Every Day Smoker  Substance and Sexual Activity  . Alcohol use: No  . Drug use: Yes    Types: Marijuana  . Sexual activity: Not on file  Other Topics Concern  . Not on file  Social History Narrative  . Not on file   Social Determinants of Health   Financial Resource Strain:   . Difficulty of Paying Living Expenses:   Food Insecurity:   . Worried About Charity fundraiser in the Last Year:   . Arboriculturist in the Last Year:   Transportation Needs:   . Film/video editor (Medical):   Marland Kitchen Lack of Transportation (Non-Medical):   Physical Activity:   . Days of Exercise per Week:   . Minutes of Exercise per Session:   Stress:   . Feeling of Stress :   Social Connections:   . Frequency of Communication with Friends and  Family:   . Frequency of Social Gatherings with Friends and Family:   . Attends Religious Services:   . Active Member of Clubs or Organizations:   . Attends Archivist Meetings:   Marland Kitchen Marital Status:   Intimate Partner Violence:   . Fear of Current or Ex-Partner:   . Emotionally Abused:   Marland Kitchen Physically Abused:   . Sexually Abused:     FAMILY HISTORY:  Family History  Problem Relation Age of Onset  . Cancer Maternal Grandmother   . COPD Paternal Grandmother   . Emphysema Paternal Grandmother   . Cancer Paternal Grandfather   . Healthy Daughter   . Healthy Daughter   . Healthy Son     CURRENT MEDICATIONS:  Outpatient Encounter Medications as of 04/28/2019  Medication Sig  . varenicline (CHANTIX STARTING MONTH PAK) 0.5 MG X 11 & 1 MG X 42 tablet Take one 0.5 mg tablet by mouth once daily for 3 days, then increase to one 0.5 mg tablet twice daily for 4 days, then increase to one 1 mg tablet twice daily.   No facility-administered encounter medications on file as of 04/28/2019.    ALLERGIES:  Allergies  Allergen Reactions  . Other     Beer      PHYSICAL EXAM:  ECOG Performance status:  0  Vitals:   04/28/19 1514  BP: (!) 111/53  Pulse: 75  Resp: 18  Temp: 97.8 F (36.6 C)  SpO2: 100%   Filed Weights   04/28/19 1514  Weight: 168 lb 12.8 oz (76.6 kg)    Physical Exam Vitals reviewed.  Constitutional:      Appearance: Normal appearance.  Eyes:     Conjunctiva/sclera: Conjunctivae normal.  Neurological:     General: No focal deficit present.     Mental Status: She is alert and oriented to person, place, and time.  Psychiatric:        Mood and Affect: Mood normal.        Behavior: Behavior normal.      LABORATORY DATA:  I have reviewed the labs as listed.  CBC    Component Value Date/Time   WBC 14.7 (H) 04/06/2019 1505   RBC 4.81 04/06/2019 1505   RBC 4.78 04/06/2019 1505   HGB 15.1 (H) 04/06/2019 1505   HCT 44.9 04/06/2019 1505   PLT 297  04/06/2019 1505   MCV 93.3 04/06/2019 1505   MCH 31.4 04/06/2019 1505   MCHC 33.6 04/06/2019 1505   RDW 13.0 04/06/2019 1505   LYMPHSABS 4.8 (H) 04/06/2019 1505   MONOABS 0.7 04/06/2019 1505   EOSABS 0.2 04/06/2019 1505   BASOSABS 0.1 04/06/2019 1505   CMP Latest Ref Rng & Units 05/03/2015 08/03/2013  Glucose 65 - 99 mg/dL 88 91  BUN 6 - 20 mg/dL 12 13  Creatinine 0.44 - 1.00 mg/dL 0.70 0.79  Sodium 135 - 145 mmol/L 140 143  Potassium 3.5 - 5.1 mmol/L 3.9 4.1  Chloride 101 - 111 mmol/L 104 106  CO2 22 - 32 mmol/L 30 27  Calcium 8.9 - 10.3 mg/dL 9.4 10.0       DIAGNOSTIC IMAGING:  I have independently reviewed the scans and discussed with the patient.     ASSESSMENT & PLAN:   Leukocytosis 1.  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  Hemoglobin and platelets were normal. -No infections/systemic steroid use/history of splenectomy.  No personal history of connective tissue disorders. -Has some stiffness in the hand joints on and off. -CT AP from 2015 shows normal spleen. -Labs from 04/06/2019 were reviewed with her.  White count was 14.7 with elevated ANC and ALC.  Myeloproliferative disorder work-up was negative.  Flow cytometry did not show any B-cell lymphoproliferative disorders. -ANA was borderline positive with 1 and 80.  Rheumatoid panel was negative.  LDH and CRP was normal. -She is planning to cut back and completely quit smoking. 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  If the white count increased significantly, will consider bone marrow biopsy. -We have sent a prescription for Chantix starter pack and reviewed some of the important side effects. -We will plan to see her back in 4 months for follow-up with repeat CBC, LDH and ANA.  2.  Bleeding per rectum: -She had bleeding per rectum even without bowel movements on and off.  She had a history of  hemorrhoids with very minimal bleeding, with spotting on tissue. -We have made referral to GI.      Orders placed this encounter:  Orders Placed This Encounter  Procedures  . CBC with Differential  . Comprehensive metabolic panel  . ANA, IFA (with reflex)      Derek Jack, MD [place] [phone]

## 2019-04-28 NOTE — Assessment & Plan Note (Addendum)
1.  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  Hemoglobin and platelets were normal. -No infections/systemic steroid use/history of splenectomy.  No personal history of connective tissue disorders. -Has some stiffness in the hand joints on and off. -CT AP from 2015 shows normal spleen. -Labs from 04/06/2019 were reviewed with her.  White count was 14.7 with elevated ANC and ALC.  Myeloproliferative disorder work-up was negative.  Flow cytometry did not show any B-cell lymphoproliferative disorders. -ANA was borderline positive with 1 and 80.  Rheumatoid panel was negative.  LDH and CRP was normal. -She is planning to cut back and completely quit smoking. 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  If the white count increased significantly, will consider bone marrow biopsy. -We have sent a prescription for Chantix starter pack and reviewed some of the important side effects. -We will plan to see her back in 4 months for follow-up with repeat CBC, LDH and ANA.  2.  Bleeding per rectum: -She had bleeding per rectum even without bowel movements on and off.  She had a history of hemorrhoids with very minimal bleeding, with spotting on tissue. -We have made referral to GI.

## 2019-04-28 NOTE — Patient Instructions (Addendum)
[place] at [facility] Discharge Instructions  You were seen today by Dr. Delton Coombes. He went over your recent test results. Your labs were all normal except your Lupus test, it was positive. He will recheck your labs prior to your next visit to reevaluate your Lupus test. He will see you back in 4 months for labs and follow up.   Thank you for choosing [place] at [facility] to provide your oncology and hematology care.  To afford each patient quality time with our provider, please arrive at least 15 minutes before your scheduled appointment time.   If you have a lab appointment with the [facility] please come in thru the  Main Entrance and check in at the main information desk  You need to re-schedule your appointment should you arrive 10 or more minutes late.  We strive to give you quality time with our providers, and arriving late affects you and other patients whose appointments are after yours.  Also, if you no show three or more times for appointments you may be dismissed from the clinic at the providers discretion.     Again, thank you for choosing [facility].  Our hope is that these requests will decrease the amount of time that you wait before being seen by our physicians.       _____________________________________________________________  Should you have questions after your visit to [facility], please contact our office at [phone] between the hours of 8:00 a.m. and 4:30 p.m.  Voicemails left after 4:00 p.m. will not be returned until the following business day.  For prescription refill requests, have your pharmacy contact our office and allow 72 hours.    Cancer Center Support Programs:   > Cancer Support Group  2nd Tuesday of the month 1pm-2pm, Journey Room

## 2019-05-13 ENCOUNTER — Other Ambulatory Visit: Payer: Self-pay

## 2019-05-13 ENCOUNTER — Encounter: Payer: Self-pay | Admitting: Nurse Practitioner

## 2019-05-13 ENCOUNTER — Ambulatory Visit: Payer: BC Managed Care – PPO | Admitting: Nurse Practitioner

## 2019-05-13 DIAGNOSIS — K625 Hemorrhage of anus and rectum: Secondary | ICD-10-CM | POA: Diagnosis not present

## 2019-05-13 NOTE — Progress Notes (Signed)
Primary Care Physician:  Ginger Organ Primary Gastroenterologist:  Dr. Gala Romney  Chief Complaint  Patient presents with  . Rectal Bleeding    going on x few weeks,none in few days, seen blood in stool. No TCS done prior. Fathers side has history polyps    HPI:   Brittney White is a 39 y.o. female who presents on referral from oncology for rectal bleeding.  Reviewed information associated with referral including office visit dated 04/06/2019 for leukocytosis.  At that time noted rectal bleeding more profuse than before for the last 2 weeks.  Present with and without stools.  No constipation or change in bowel habits.  History of hemorrhoids since first pregnancy but bleeding much less and on and off and previously was only on the toilet tissue.  Recent bleeding is more substantial.  Further work-up pending for leukocytosis.  Recommended referral to GI.  Today she states she's doing ok overall. Hemorrhoid history since pregnancy (first was 20 years ago) but typically mild scant toilet tissue hematochezia on/off. Has had heavier bleeding for a couple weeks although seems to be slowing up this week. When it was worsened it was with or without bowel movements, especially with any straining. States her digestive system is not the best. Has flipping constipation and diarrhea with occasional straining. Denies rectal pain, itching, irritation. Denies abdominal pain, N/V, melena, fever, chills, unintentional weight loss. Denies URI or flu-like symptoms. Denies loss of sense of taste or smell. Was tested for COVID-19 a month ago and was negative. Hasn't gotten a vaccine yet. Has appointment being scheduled through [facility] for vaccination due to chronic health history. Denies chest pain, dyspnea, dizziness, lightheadedness, syncope, near syncope. Denies any other upper or lower GI symptoms.  Is trying to quit smoking on Chantix.  Past Medical History:  Diagnosis Date  . Baby born premature   . Lupus  [facility])     Past Surgical History:  Procedure Laterality Date  . LUNG SURGERY    . reconstructive surgery to ears      Current Outpatient Medications  Medication Sig Dispense Refill  . varenicline (CHANTIX STARTING MONTH PAK) 0.5 MG X 11 & 1 MG X 42 tablet Take one 0.5 mg tablet by mouth once daily for 3 days, then increase to one 0.5 mg tablet twice daily for 4 days, then increase to one 1 mg tablet twice daily. 53 tablet 0   No current facility-administered medications for this visit.    Allergies as of 05/13/2019 - Review Complete 05/13/2019  Allergen Reaction Noted  . Other  03/03/2014    Family History  Problem Relation Age of Onset  . Cancer Maternal Grandmother   . COPD Paternal Grandmother   . Emphysema Paternal Grandmother   . Lymphoma Paternal Grandfather   . Colon polyps Paternal Grandfather   . Healthy Daughter   . Healthy Daughter   . Healthy Son   . Alcoholism Paternal Uncle   . Cirrhosis Paternal Uncle   . Colon polyps Paternal Uncle   . Colon cancer Neg Hx     Social History   Socioeconomic History  . Marital status: Legally Separated    Spouse name: Not on file  . Number of children: 3  . Years of education: Not on file  . Highest education level: Not on file  Occupational History  . Not on file  Tobacco Use  . Smoking status: Current Every Day Smoker  . Smokeless tobacco: Never Used  . Tobacco  comment: Down to 3-4 ciggs a day (on Chantix)  Substance and Sexual Activity  . Alcohol use: No  . Drug use: Yes    Frequency: 2.0 times per week    Types: Marijuana    Comment: occas  . Sexual activity: Not on file  Other Topics Concern  . Not on file  Social History Narrative  . Not on file   Social Determinants of Health   Financial Resource Strain:   . Difficulty of Paying Living Expenses:   Food Insecurity:   . Worried About Charity fundraiser in the Last Year:   . Arboriculturist in the Last Year:   Transportation Needs:   . Lexicographer (Medical):   Marland Kitchen Lack of Transportation (Non-Medical):   Physical Activity:   . Days of Exercise per Week:   . Minutes of Exercise per Session:   Stress:   . Feeling of Stress :   Social Connections:   . Frequency of Communication with Friends and Family:   . Frequency of Social Gatherings with Friends and Family:   . Attends Religious Services:   . Active Member of Clubs or Organizations:   . Attends Archivist Meetings:   Marland Kitchen Marital Status:   Intimate Partner Violence:   . Fear of Current or Ex-Partner:   . Emotionally Abused:   Marland Kitchen Physically Abused:   . Sexually Abused:     Subjective: Review of Systems  Constitutional: Negative for chills, fever, malaise/fatigue and weight loss.  HENT: Negative for congestion and sore throat.   Respiratory: Negative for cough and shortness of breath.   Cardiovascular: Negative for chest pain and palpitations.  Gastrointestinal: Positive for blood in stool. Negative for abdominal pain, diarrhea, melena, nausea and vomiting.  Musculoskeletal: Positive for joint pain (worse in the mornings, improves once she gets going). Negative for myalgias.  Skin: Negative for rash.  Neurological: Negative for dizziness and weakness.  Endo/Heme/Allergies: Does not bruise/bleed easily.  Psychiatric/Behavioral: Negative for depression. The patient is not nervous/anxious.   All other systems reviewed and are negative.      Objective: BP 123/76   Pulse 84   Temp (!) 97 F (36.1 C) (Oral)   Ht 5\' 3"  (1.6 m)   Wt 168 lb 12.8 oz (76.6 kg)   BMI 29.90 kg/m  Physical Exam Vitals and nursing note reviewed.  Constitutional:      General: She is not in acute distress.    Appearance: Normal appearance. She is well-developed and normal weight. She is not ill-appearing, toxic-appearing or diaphoretic.  HENT:     Head: Normocephalic and atraumatic.  Cardiovascular:     Rate and Rhythm: Normal rate and regular rhythm.     Heart sounds:  Normal heart sounds.  Pulmonary:     Effort: Pulmonary effort is normal. No respiratory distress.     Breath sounds: Normal breath sounds.  Abdominal:     General: Bowel sounds are normal.     Palpations: Abdomen is soft. There is no hepatomegaly, splenomegaly or mass.     Tenderness: There is no abdominal tenderness. There is no guarding or rebound.     Hernia: No hernia is present.  Musculoskeletal:        General: No swelling or deformity.     Right lower leg: No edema.     Left lower leg: No edema.  Skin:    General: Skin is warm and dry.     Coloration: Skin  is not jaundiced.     Findings: No rash.  Neurological:     General: No focal deficit present.     Mental Status: She is alert and oriented to person, place, and time.  Psychiatric:        Attention and Perception: Attention normal.        Mood and Affect: Mood normal.        Speech: Speech normal.        Behavior: Behavior normal.        Thought Content: Thought content normal.        Cognition and Memory: Cognition and memory normal.       05/13/2019 11:45 AM   Disclaimer: This note was dictated with voice recognition software. Similar sounding words can inadvertently be transcribed and may not be corrected upon review.

## 2019-05-13 NOTE — Patient Instructions (Signed)
Your health issues we discussed today were:   Rectal bleeding: 1. As we discussed we will plan for colonoscopy 2. Further recommendations will be made after colonoscopy 3. Call us if you have any worsening or severe bleeding  Overall I recommend:  1. Return for follow-up in 6 months 2. Continue your current medications and best of luck with quitting smoking! 3. Call us if you have any questions or concerns 4. I know you are working to get a vaccine scheduled through [facility], but I will give you contact information below related to [facility] vaccines   ---------------------------------------------------------------  COVID-19 Vaccine Information can be found at: [email or web address] For questions related to vaccine distribution or appointments, please email [email or web address] or call [phone].   ---------------------------------------------------------------   At [facility] we value your feedback. You may receive a survey about your visit today. Please share your experience as we strive to create trusting relationships with our patients to provide genuine, compassionate, quality care.  We appreciate your understanding and patience as we review any laboratory studies, imaging, and other diagnostic tests that are ordered as we care for you. Our office policy is 5 business days for review of these results, and any emergent or urgent results are addressed in a timely manner for your best interest. If you do not hear from our office in 1 week, please contact us.   We also encourage the use of MyChart, which contains your medical information for your review as well. If you are not enrolled in this feature, an access code is on this after visit summary for your convenience. Thank you for allowing [place] to be involved in your care.  It was great to see you today!  I hope you have a great day!!

## 2019-05-13 NOTE — Progress Notes (Signed)
Cc'ed to pcp °

## 2019-05-13 NOTE — Assessment & Plan Note (Signed)
History of known hemorrhoids.  Previously would have intermittent scant toilet tissue hematochezia.  2 weeks prior to her oncology visit she noted more significant bleeding including bleeding without a bowel movement.  This seems to slowed up this week but still having blood on stools.  Family history significant for colon polyps, no family history of colon cancer.  This point given her age at 39 years old and her significant increased bleeding as of late with family history of polyps I will plan for colonoscopy to further evaluate.  Further recommendations to follow.  Proceed with TCS with Dr. Gala Romney in near future: the risks, benefits, and alternatives have been discussed with the patient in detail. The patient states understanding and desires to proceed.  The patient is not on any anticoagulants, anxiolytics, chronic pain medications, antidepressants, antidiabetics, or iron supplements.  The patient does smoke marijuana about 1-2 times a week.  Denies alcohol use.  Conscious sedation should be adequate for her procedure.

## 2019-06-29 ENCOUNTER — Other Ambulatory Visit (HOSPITAL_COMMUNITY)
Admission: RE | Admit: 2019-06-29 | Discharge: 2019-06-29 | Disposition: A | Discharge status: Home / Self Care | Payer: BC Managed Care – PPO | Source: Ambulatory Visit | Attending: Internal Medicine | Admitting: Internal Medicine

## 2019-06-29 ENCOUNTER — Other Ambulatory Visit: Payer: Self-pay

## 2019-06-29 DIAGNOSIS — Z20822 Contact with and (suspected) exposure to covid-19: Secondary | ICD-10-CM | POA: Insufficient documentation

## 2019-06-29 DIAGNOSIS — Z01812 Encounter for preprocedural laboratory examination: Secondary | ICD-10-CM | POA: Diagnosis not present

## 2019-06-30 LAB — SARS CORONAVIRUS 2 (TAT 6-24 HRS): SARS Coronavirus 2: NEGATIVE

## 2019-07-02 ENCOUNTER — Ambulatory Visit (HOSPITAL_COMMUNITY)
Admission: RE | Admit: 2019-07-02 | Discharge: 2019-07-02 | Disposition: A | Discharge status: Home Health | Payer: BC Managed Care – PPO | Attending: Internal Medicine | Admitting: Internal Medicine

## 2019-07-02 ENCOUNTER — Other Ambulatory Visit: Payer: Self-pay

## 2019-07-02 ENCOUNTER — Encounter (HOSPITAL_COMMUNITY)
Admission: RE | Disposition: A | Discharge status: Home Health | Payer: Self-pay | Source: Home / Self Care | Attending: Internal Medicine

## 2019-07-02 DIAGNOSIS — D125 Benign neoplasm of sigmoid colon: Secondary | ICD-10-CM | POA: Insufficient documentation

## 2019-07-02 DIAGNOSIS — K635 Polyp of colon: Secondary | ICD-10-CM | POA: Diagnosis not present

## 2019-07-02 DIAGNOSIS — K921 Melena: Secondary | ICD-10-CM | POA: Insufficient documentation

## 2019-07-02 DIAGNOSIS — F17210 Nicotine dependence, cigarettes, uncomplicated: Secondary | ICD-10-CM | POA: Diagnosis not present

## 2019-07-02 DIAGNOSIS — K648 Other hemorrhoids: Secondary | ICD-10-CM | POA: Diagnosis not present

## 2019-07-02 DIAGNOSIS — D124 Benign neoplasm of descending colon: Secondary | ICD-10-CM | POA: Diagnosis not present

## 2019-07-02 DIAGNOSIS — M329 Systemic lupus erythematosus, unspecified: Secondary | ICD-10-CM | POA: Diagnosis not present

## 2019-07-02 HISTORY — PX: COLONOSCOPY: SHX5424

## 2019-07-02 HISTORY — PX: POLYPECTOMY: SHX5525

## 2019-07-02 SURGERY — COLONOSCOPY
Anesthesia: Moderate Sedation

## 2019-07-02 MED ORDER — ONDANSETRON HCL 4 MG/2ML IJ SOLN
INTRAMUSCULAR | Status: AC
  Filled 2019-07-02: qty 2

## 2019-07-02 MED ORDER — MEPERIDINE HCL 100 MG/ML IJ SOLN
INTRAMUSCULAR | Status: DC | PRN
  Administered 2019-07-02: 15 mg
  Administered 2019-07-02: 10 mg
  Administered 2019-07-02: 25 mg

## 2019-07-02 MED ORDER — ONDANSETRON HCL 4 MG/2ML IJ SOLN
INTRAMUSCULAR | Status: DC | PRN
  Administered 2019-07-02: 4 mg via INTRAVENOUS

## 2019-07-02 MED ORDER — SODIUM CHLORIDE 0.9 % IV SOLN: INTRAVENOUS | Status: DC

## 2019-07-02 MED ORDER — MEPERIDINE HCL 50 MG/ML IJ SOLN
INTRAMUSCULAR | Status: AC
  Filled 2019-07-02: qty 1

## 2019-07-02 MED ORDER — MIDAZOLAM HCL 5 MG/5ML IJ SOLN
INTRAMUSCULAR | Status: AC
  Filled 2019-07-02: qty 10

## 2019-07-02 MED ORDER — MIDAZOLAM HCL 5 MG/5ML IJ SOLN
INTRAMUSCULAR | Status: DC | PRN
  Administered 2019-07-02 (×3): 1 mg via INTRAVENOUS
  Administered 2019-07-02 (×2): 2 mg via INTRAVENOUS
  Administered 2019-07-02 (×3): 1 mg via INTRAVENOUS

## 2019-07-02 NOTE — H&P (Signed)
@LOGO @   Primary Care Physician:  Ginger Organ Primary Gastroenterologist:  Dr. Gala Romney  Pre-Procedure History & Physical: HPI:  Brittney White is a 39 y.o. female here for further evaluation of intermittent rectal bleeding via colonoscopy.  Father with colon polyps in his 67s.  Denies constipation or diarrhea.  Past Medical History:  Diagnosis Date  . Baby born premature   . Lupus [facility])     Past Surgical History:  Procedure Laterality Date  . LUNG SURGERY    . reconstructive surgery to ears      Prior to Admission medications   Medication Sig Start Date End Date Taking? Authorizing Provider  Multiple Vitamins-Minerals (MULTIVITAMIN WITH MINERALS) tablet Take 1 tablet by mouth daily.    [provider]  varenicline (CHANTIX STARTING MONTH PAK) 0.5 MG X 11 & 1 MG X 42 tablet Take one 0.5 mg tablet by mouth once daily for 3 days, then increase to one 0.5 mg tablet twice daily for 4 days, then increase to one 1 mg tablet twice daily. Patient not taking: Reported on 06/22/2019 04/28/19   Derek Jack, MD    Allergies as of 05/13/2019 - Review Complete 05/13/2019  Allergen Reaction Noted  . Other  03/03/2014    Family History  Problem Relation Age of Onset  . Cancer Maternal Grandmother   . COPD Paternal Grandmother   . Emphysema Paternal Grandmother   . Lymphoma Paternal Grandfather   . Colon polyps Paternal Grandfather   . Healthy Daughter   . Healthy Daughter   . Healthy Son   . Alcoholism Paternal Uncle   . Cirrhosis Paternal Uncle   . Colon polyps Paternal Uncle   . Colon cancer Neg Hx     Social History   Socioeconomic History  . Marital status: Legally Separated    Spouse name: Not on file  . Number of children: 3  . Years of education: Not on file  . Highest education level: Not on file  Occupational History  . Not on file  Tobacco Use  . Smoking status: Current Every Day Smoker  . Smokeless tobacco: Never Used  . Tobacco comment:  Down to 3-4 ciggs a day (on Chantix)  Substance and Sexual Activity  . Alcohol use: No  . Drug use: Yes    Frequency: 2.0 times per week    Types: Marijuana    Comment: occas  . Sexual activity: Not on file  Other Topics Concern  . Not on file  Social History Narrative  . Not on file   Social Determinants of Health   Financial Resource Strain:   . Difficulty of Paying Living Expenses:   Food Insecurity:   . Worried About Charity fundraiser in the Last Year:   . Arboriculturist in the Last Year:   Transportation Needs:   . Film/video editor (Medical):   Marland Kitchen Lack of Transportation (Non-Medical):   Physical Activity:   . Days of Exercise per Week:   . Minutes of Exercise per Session:   Stress:   . Feeling of Stress :   Social Connections:   . Frequency of Communication with Friends and Family:   . Frequency of Social Gatherings with Friends and Family:   . Attends Religious Services:   . Active Member of Clubs or Organizations:   . Attends Archivist Meetings:   Marland Kitchen Marital Status:   Intimate Partner Violence:   . Fear of Current or Ex-Partner:   .  Emotionally Abused:   Marland Kitchen Physically Abused:   . Sexually Abused:     Review of Systems: See HPI, otherwise negative ROS  Physical Exam: BP 115/74   Pulse 72   Temp 98.3 F (36.8 C) (Oral)   Resp 12   Ht 5\' 2"  (1.575 m)   Wt 77.1 kg   SpO2 100%   BMI 31.09 kg/m  General:   Alert,  Well-developed, well-nourished, pleasant and cooperative in NAD Neck:  Supple; no masses or thyromegaly. No significant cervical adenopathy. Lungs:  Clear throughout to auscultation.   No wheezes, crackles, or rhonchi. No acute distress. Heart:  Regular rate and rhythm; no murmurs, clicks, rubs,  or gallops. Abdomen: Non-distended, normal bowel sounds.  Soft and nontender without appreciable mass or hepatosplenomegaly.  Pulses:  Normal pulses noted. Extremities:  Without clubbing or edema.  Impression/Plan: 39 year old lady  with intermittent paper hematochezia.  Positive family history of colon polyps a young age in first-degree relative.  Colonoscopy today per plan for diagnostic purpose. The risks, benefits, limitations, alternatives and imponderables have been reviewed with the patient. Questions have been answered. All parties are agreeable.      Notice: This dictation was prepared with Dragon dictation along with smaller phrase technology. Any transcriptional errors that result from this process are unintentional and may not be corrected upon review.

## 2019-07-02 NOTE — Discharge Instructions (Signed)
Colonoscopy Discharge Instructions  Read the instructions outlined below and refer to this sheet in the next few weeks. These discharge instructions provide you with general information on caring for yourself after you leave the hospital. Your doctor may also give you specific instructions. While your treatment has been planned according to the most current medical practices available, unavoidable complications occasionally occur. If you have any problems or questions after discharge, call Dr. Gala Romney at [phone]. ACTIVITY  You may resume your regular activity, but move at a slower pace for the next 24 hours.   Take frequent rest periods for the next 24 hours.   Walking will help get rid of the air and reduce the bloated feeling in your belly (abdomen).   No driving for 24 hours (because of the medicine (anesthesia) used during the test).    Do not sign any important legal documents or operate any machinery for 24 hours (because of the anesthesia used during the test).  NUTRITION  Drink plenty of fluids.   You may resume your normal diet as instructed by your doctor.   Begin with a light meal and progress to your normal diet. Heavy or fried foods are harder to digest and may make you feel sick to your stomach (nauseated).   Avoid alcoholic beverages for 24 hours or as instructed.  MEDICATIONS  You may resume your normal medications unless your doctor tells you otherwise.  WHAT YOU CAN EXPECT TODAY  Some feelings of bloating in the abdomen.   Passage of more gas than usual.   Spotting of blood in your stool or on the toilet paper.  IF YOU HAD POLYPS REMOVED DURING THE COLONOSCOPY:  No aspirin products for 7 days or as instructed.   No alcohol for 7 days or as instructed.   Eat a soft diet for the next 24 hours.  FINDING OUT THE RESULTS OF YOUR TEST Not all test results are available during your visit. If your test results are not back during the visit, make an appointment  with your caregiver to find out the results. Do not assume everything is normal if you have not heard from your caregiver or the medical facility. It is important for you to follow up on all of your test results.  SEEK IMMEDIATE MEDICAL ATTENTION IF:  You have more than a spotting of blood in your stool.   Your belly is swollen (abdominal distention).   You are nauseated or vomiting.   You have a temperature over 101.   You have abdominal pain or discomfort that is severe or gets worse throughout the day.    Colon polyp and hemorrhoid information provided  3 polyps removed from your colon today  Further recommendations to follow pending review of pathology report  Hemorrhoid banding pamphlet provided regarding results.  Office visit with [place] (AB) in 6 weeks  At patient request, I spoke to [place] [facility] at [phone]   Hemorrhoids Hemorrhoids are swollen veins in and around the rectum or anus. There are two types of hemorrhoids:  Internal hemorrhoids. These occur in the veins that are just inside the rectum. They may poke through to the outside and become irritated and painful.  External hemorrhoids. These occur in the veins that are outside the anus and can be felt as a painful swelling or hard lump near the anus. Most hemorrhoids do not cause serious problems, and they can be managed with home treatments such as diet and lifestyle changes. If home treatments do not  help the symptoms, procedures can be done to shrink or remove the hemorrhoids. What are the causes? This condition is caused by increased pressure in the anal area. This pressure may result from various things, including:  Constipation.  Straining to have a bowel movement.  Diarrhea.  Pregnancy.  Obesity.  Sitting for long periods of time.  Heavy lifting or other activity that causes you to strain.  Anal sex.  Riding a bike for a long period of time. What are the signs or symptoms? Symptoms of this  condition include:  Pain.  Anal itching or irritation.  Rectal bleeding.  Leakage of stool (feces).  Anal swelling.  One or more lumps around the anus. How is this diagnosed? This condition can often be diagnosed through a visual exam. Other exams or tests may also be done, such as:  An exam that involves feeling the rectal area with a gloved hand (digital rectal exam).  An exam of the anal canal that is done using a small tube (anoscope).  A blood test, if you have lost a significant amount of blood.  A test to look inside the colon using a flexible tube with a camera on the end (sigmoidoscopy or colonoscopy). How is this treated? This condition can usually be treated at home. However, various procedures may be done if dietary changes, lifestyle changes, and other home treatments do not help your symptoms. These procedures can help make the hemorrhoids smaller or remove them completely. Some of these procedures involve surgery, and others do not. Common procedures include:  Rubber band ligation. Rubber bands are placed at the base of the hemorrhoids to cut off their blood supply.  Sclerotherapy. Medicine is injected into the hemorrhoids to shrink them.  Infrared coagulation. A type of light energy is used to get rid of the hemorrhoids.  Hemorrhoidectomy surgery. The hemorrhoids are surgically removed, and the veins that supply them are tied off.  Stapled hemorrhoidopexy surgery. The surgeon staples the base of the hemorrhoid to the rectal wall. Follow these instructions at home: Eating and drinking   Eat foods that have a lot of fiber in them, such as whole grains, beans, nuts, fruits, and vegetables.  Ask your health care provider about taking products that have added fiber (fiber supplements).  Reduce the amount of fat in your diet. You can do this by eating low-fat dairy products, eating less red meat, and avoiding processed foods.  Drink enough fluid to keep your  urine pale yellow. Managing pain and swelling   Take warm sitz baths for 20 minutes, 3-4 times a day to ease pain and discomfort. You may do this in a bathtub or using a portable sitz bath that fits over the toilet.  If directed, apply ice to the affected area. Using ice packs between sitz baths may be helpful. ? Put ice in a plastic bag. ? Place a towel between your skin and the bag. ? Leave the ice on for 20 minutes, 2-3 times a day. General instructions  Take over-the-counter and prescription medicines only as told by your health care provider.  Use medicated creams or suppositories as told.  Get regular exercise. Ask your health care provider how much and what kind of exercise is best for you. In general, you should do moderate exercise for at least 30 minutes on most days of the week (150 minutes each week). This can include activities such as walking, biking, or yoga.  Go to the bathroom when you have the urge  to have a bowel movement. Do not wait.  Avoid straining to have bowel movements.  Keep the anal area dry and clean. Use wet toilet paper or moist towelettes after a bowel movement.  Do not sit on the toilet for long periods of time. This increases blood pooling and pain.  Keep all follow-up visits as told by your health care provider. This is important. Contact a health care provider if you have:  Increasing pain and swelling that are not controlled by treatment or medicine.  Difficulty having a bowel movement, or you are unable to have a bowel movement.  Pain or inflammation outside the area of the hemorrhoids. Get help right away if you have:  Uncontrolled bleeding from your rectum. Summary  Hemorrhoids are swollen veins in and around the rectum or anus.  Most hemorrhoids can be managed with home treatments such as diet and lifestyle changes.  Taking warm sitz baths can help ease pain and discomfort.  In severe cases, procedures or surgery can be done to  shrink or remove the hemorrhoids. This information is not intended to replace advice given to you by your health care provider. Make sure you discuss any questions you have with your health care provider. Document Revised: 06/26/2018 Document Reviewed: 06/19/2017 Elsevier Patient Education  [place].  Colon Polyps  Polyps are tissue growths inside the body. Polyps can grow in many places, including the large intestine (colon). A polyp may be a round bump or a mushroom-shaped growth. You could have one polyp or several. Most colon polyps are noncancerous (benign). However, some colon polyps can become cancerous over time. Finding and removing the polyps early can help prevent this. What are the causes? The exact cause of colon polyps is not known. What increases the risk? You are more likely to develop this condition if you:  Have a family history of colon cancer or colon polyps.  Are older than 5 or older than 45 if you are African American.  Have inflammatory bowel disease, such as ulcerative colitis or Crohn's disease.  Have certain hereditary conditions, such as: ? Familial adenomatous polyposis. ? Lynch syndrome. ? Turcot syndrome. ? Peutz-Jeghers syndrome.  Are overweight.  Smoke cigarettes.  Do not get enough exercise.  Drink too much alcohol.  Eat a diet that is high in fat and red meat and low in fiber.  Had childhood cancer that was treated with abdominal radiation. What are the signs or symptoms? Most polyps do not cause symptoms. If you have symptoms, they may include:  Blood coming from your rectum when having a bowel movement.  Blood in your stool. The stool may look dark red or black.  Abdominal pain.  A change in bowel habits, such as constipation or diarrhea. How is this diagnosed? This condition is diagnosed with a colonoscopy. This is a procedure in which a lighted, flexible scope is inserted into the anus and then passed into the colon to  examine the area. Polyps are sometimes found when a colonoscopy is done as part of routine cancer screening tests. How is this treated? Treatment for this condition involves removing any polyps that are found. Most polyps can be removed during a colonoscopy. Those polyps will then be tested for cancer. Additional treatment may be needed depending on the results of testing. Follow these instructions at home: Lifestyle  Maintain a healthy weight, or lose weight if recommended by your health care provider.  Exercise every day or as told by your health care  provider.  Do not use any products that contain nicotine or tobacco, such as cigarettes and e-cigarettes. If you need help quitting, ask your health care provider.  If you drink alcohol, limit how much you have: ? 0-1 drink a day for women. ? 0-2 drinks a day for men.  Be aware of how much alcohol is in your drink. In the [place] one drink equals one 12 oz bottle of beer (355 mL), one 5 oz glass of wine (148 mL), or one 1 oz shot of hard liquor (44 mL). Eating and drinking   Eat foods that are high in fiber, such as fruits, vegetables, and whole grains.  Eat foods that are high in calcium and vitamin D, such as milk, cheese, yogurt, eggs, liver, fish, and broccoli.  Limit foods that are high in fat, such as fried foods and desserts.  Limit the amount of red meat and processed meat you eat, such as hot dogs, sausage, bacon, and lunch meats. General instructions  Keep all follow-up visits as told by your health care provider. This is important. ? This includes having regularly scheduled colonoscopies. ? Talk to your health care provider about when you need a colonoscopy. Contact a health care provider if:  You have new or worsening bleeding during a bowel movement.  You have new or increased blood in your stool.  You have a change in bowel habits.  You lose weight for no known reason. Summary  Polyps are tissue growths inside  the body. Polyps can grow in many places, including the colon.  Most colon polyps are noncancerous (benign), but some can become cancerous over time.  This condition is diagnosed with a colonoscopy.  Treatment for this condition involves removing any polyps that are found. Most polyps can be removed during a colonoscopy. This information is not intended to replace advice given to you by your health care provider. Make sure you discuss any questions you have with your health care provider. Document Revised: 05/15/2017 Document Reviewed: 05/15/2017 Elsevier Patient Education  [place].

## 2019-07-02 NOTE — Op Note (Signed)
[name] Patient Name: Brittney White Procedure Date: 07/02/2019 8:17 AM MRN: [id number] Date of Birth: 1981-02-04 Attending MD: Norvel Richards , MD CSN: [id number] Age: 39 Admit Type: Outpatient Procedure:                Colonoscopy Indications:              Hematochezia Providers:                Norvel Richards, MD, Janeece Riggers, RN, Randa Spike, Technician Referring MD:              Medicines:                Midazolam 10 mg IV, Meperidine 50 mg IV Complications:            No immediate complications. Estimated Blood Loss:     Estimated blood loss was minimal. Procedure:                After obtaining informed consent, the colonoscope                            was passed under direct vision. Throughout the                            procedure, the patient's blood pressure, pulse, and                            oxygen saturations were monitored continuously. The                            [id number] scope was introduced through                            the anus and advanced to the the cecum, identified                            by appendiceal orifice and ileocecal valve. Scope In: 8:40:35 AM Scope Out: 8:58:19 AM Scope Withdrawal Time: 0 hours 13 minutes 59 seconds  Total Procedure Duration: 0 hours 17 minutes 44 seconds  Findings:      The perianal and digital rectal examinations were normal.      A 4 mm polyp was found in the descending colon. The polyp was sessile.       The polyp was removed with a cold snare. Resection and retrieval were       complete. Estimated blood loss was minimal.      Single 4 mm polyp in the area the ascending colon was cold snared but       did not survive processing.      A 10 mm polyp was found in the sigmoid colon. The polyp was       semi-pedunculated. The polyp was removed with a hot snare. Resection and       retrieval were complete. Estimated blood loss: none.      The exam was  otherwise without abnormality on direct and retroflexion       views. Internal  hemorrhoids?grade 1 present. Impression:               - One 4 mm polyp in the descending colon, removed                            with a cold snare. Resected and retrieved.                           - One 10 mm polyp in the sigmoid colon, removed                            with a hot snare. Resected and retrieved. Internal                            hemorrhoids.                           - The examination was otherwise normal on direct                            and retroflexion views. I suspect patient has                            experience benign anorectal bleeding from                            hemorrhoids. Moderate Sedation:      Moderate (conscious) sedation was administered by the endoscopy nurse       and supervised by the endoscopist. The following parameters were       monitored: oxygen saturation, heart rate, blood pressure, respiratory       rate, EKG, adequacy of pulmonary ventilation, and response to care.       Total physician intraservice time was 28 minutes. Recommendation:           - Patient has a contact number available for                            emergencies. The signs and symptoms of potential                            delayed complications were discussed with the                            patient. Return to normal activities tomorrow.                            Written discharge instructions were provided to the                            patient.                           - Advance diet as tolerated.                           -  Continue present medications. Hemorrhoid                            information provided. Hemorrhoid banding pamphlet                            provided.- Repeat colonoscopy in 5 years for                            surveillance based on pathology results.                           - Return to GI office in 6 weeks. Procedure Code(s):        ---  Professional ---                           [phone], Colonoscopy, flexible; with removal of                            tumor(s), polyp(s), or other lesion(s) by snare                            technique                           99153, Moderate sedation; each additional 15                            minutes intraservice time                           G0500, Moderate sedation services provided by the                            same physician or other qualified health care                            professional performing a gastrointestinal                            endoscopic service that sedation supports,                            requiring the presence of an independent trained                            observer to assist in the monitoring of the                            patient's level of consciousness and physiological                            status; initial 15 minutes of intra-service time;                            patient age  5 years or older (additional time may                            be reported with [phone], as appropriate) Diagnosis Code(s):        --- Professional ---                           K63.5, Polyp of colon                           K92.1, Melena (includes Hematochezia) CPT copyright 2019 American Medical Association. All rights reserved. The codes documented in this report are preliminary and upon coder review may  be revised to meet current compliance requirements. Cristopher Estimable. Viviane Semidey, MD Norvel Richards, MD 07/02/2019 10:01:06 AM This report has been signed electronically. Number of Addenda: 0

## 2019-07-05 ENCOUNTER — Encounter: Payer: Self-pay | Admitting: Internal Medicine

## 2019-07-05 LAB — SURGICAL PATHOLOGY

## 2019-08-11 ENCOUNTER — Encounter: Payer: Self-pay | Admitting: Gastroenterology

## 2019-08-11 NOTE — Progress Notes (Deleted)
39 year old female presenting today in follow-up after colonoscopy completed for rectal bleeding. May 2021: one 4 mm polyp in descending colon, one 10 mm polyp in sigmoid, internal hemorrhoids. Tubular adenomas. 5 year surveillance.

## 2019-08-12 ENCOUNTER — Encounter: Payer: Self-pay | Admitting: Gastroenterology

## 2019-08-12 ENCOUNTER — Ambulatory Visit: Payer: BC Managed Care – PPO | Admitting: Gastroenterology

## 2019-08-24 ENCOUNTER — Inpatient Hospital Stay (HOSPITAL_COMMUNITY): Payer: BC Managed Care – PPO

## 2019-09-01 ENCOUNTER — Ambulatory Visit (HOSPITAL_COMMUNITY): Payer: BC Managed Care – PPO | Admitting: Hematology

## 2019-11-15 ENCOUNTER — Telehealth: Payer: Self-pay

## 2019-11-15 NOTE — Telephone Encounter (Signed)
FYI, pt called and cancelled apt for tomorrow 11/16/19, due to finding out today of a covid exposure. Pt has been rescheduled.

## 2019-11-16 ENCOUNTER — Ambulatory Visit: Payer: BC Managed Care – PPO | Admitting: Nurse Practitioner

## 2019-11-16 ENCOUNTER — Ambulatory Visit: Payer: BC Managed Care – PPO | Admitting: Gastroenterology

## 2019-11-16 NOTE — Telephone Encounter (Signed)
noted 

## 2019-12-24 ENCOUNTER — Ambulatory Visit: Payer: BC Managed Care – PPO | Admitting: Gastroenterology

## 2020-08-30 DIAGNOSIS — M791 Myalgia, unspecified site: Secondary | ICD-10-CM | POA: Diagnosis not present

## 2020-08-30 DIAGNOSIS — Z20822 Contact with and (suspected) exposure to covid-19: Secondary | ICD-10-CM | POA: Diagnosis not present

## 2020-08-30 DIAGNOSIS — U071 COVID-19: Secondary | ICD-10-CM | POA: Diagnosis not present

## 2020-10-12 DIAGNOSIS — F419 Anxiety disorder, unspecified: Secondary | ICD-10-CM | POA: Diagnosis not present

## 2020-10-12 DIAGNOSIS — Z20822 Contact with and (suspected) exposure to covid-19: Secondary | ICD-10-CM | POA: Diagnosis not present

## 2020-10-12 DIAGNOSIS — K047 Periapical abscess without sinus: Secondary | ICD-10-CM | POA: Diagnosis not present

## 2020-12-20 DIAGNOSIS — Z6831 Body mass index (BMI) 31.0-31.9, adult: Secondary | ICD-10-CM | POA: Diagnosis not present

## 2020-12-20 DIAGNOSIS — M329 Systemic lupus erythematosus, unspecified: Secondary | ICD-10-CM | POA: Diagnosis not present

## 2020-12-20 DIAGNOSIS — Z1331 Encounter for screening for depression: Secondary | ICD-10-CM | POA: Diagnosis not present

## 2020-12-20 DIAGNOSIS — Z13220 Encounter for screening for lipoid disorders: Secondary | ICD-10-CM | POA: Diagnosis not present

## 2020-12-20 DIAGNOSIS — E6609 Other obesity due to excess calories: Secondary | ICD-10-CM | POA: Diagnosis not present

## 2020-12-20 DIAGNOSIS — M5416 Radiculopathy, lumbar region: Secondary | ICD-10-CM | POA: Diagnosis not present

## 2020-12-20 DIAGNOSIS — M47816 Spondylosis without myelopathy or radiculopathy, lumbar region: Secondary | ICD-10-CM | POA: Diagnosis not present

## 2021-03-09 DIAGNOSIS — E539 Vitamin B deficiency, unspecified: Secondary | ICD-10-CM | POA: Diagnosis not present

## 2021-03-09 DIAGNOSIS — M47816 Spondylosis without myelopathy or radiculopathy, lumbar region: Secondary | ICD-10-CM | POA: Diagnosis not present

## 2021-03-09 DIAGNOSIS — E669 Obesity, unspecified: Secondary | ICD-10-CM | POA: Diagnosis not present

## 2021-03-09 DIAGNOSIS — M329 Systemic lupus erythematosus, unspecified: Secondary | ICD-10-CM | POA: Diagnosis not present

## 2021-03-09 DIAGNOSIS — Z0001 Encounter for general adult medical examination with abnormal findings: Secondary | ICD-10-CM | POA: Diagnosis not present

## 2021-03-09 DIAGNOSIS — Z6831 Body mass index (BMI) 31.0-31.9, adult: Secondary | ICD-10-CM | POA: Diagnosis not present

## 2021-03-25 NOTE — Progress Notes (Signed)
Cardiology Office Note:    Date:  03/27/2021   ID:  Brittney White, DOB 1980/09/20, MRN [id number]  PCP:  Cory Munch, PA-C  Cardiologist:  Donato Heinz, MD  Electrophysiologist:  None   Referring MD: Redmond School, MD   Chief Complaint  Patient presents with   Chest Pain    History of Present Illness:    Brittney White is a 41 y.o. female with a hx of lupus, hyperlipidemia who is referred by Dr. Gerarda Fraction for evaluation of chest pain.  She reports that she started having chest pain about a year ago.  Has been having a couple times per week, feels like tightness in chest.  Sometimes can last for few minutes but sometimes can last for up to an hour.  Can be brought on by exertion but also occurs with rest.  Does feel like it happens when she is under stress.  Feels short of breath during episodes.  Also reports lightheadedness but denies any syncope.  Denies any lower extremity edema or palpitations.  She smoked 1 pack/day for over 20 years, quit age 23.  Smokes marijuana daily.  Family history includes father had MI in late 71s.   Past Medical History:  Diagnosis Date   Baby born premature    Lupus [facility])     Past Surgical History:  Procedure Laterality Date   COLONOSCOPY N/A 07/02/2019   one 4 mm polyp in descending colon, one 10 mm polyp in sigmoid, internal hemorrhoids. Tubular adenomas. 5 year surveillance.    LUNG SURGERY     POLYPECTOMY  07/02/2019   Procedure: POLYPECTOMY;  Surgeon: Daneil Dolin, MD;  Location: [facility];  Service: Endoscopy;;   reconstructive surgery to ears      Current Medications: Current Meds  Medication Sig   metoprolol tartrate (LOPRESSOR) 50 MG tablet Take 2 hours before CT scan   Multiple Vitamins-Minerals (MULTIVITAMIN WITH MINERALS) tablet Take 1 tablet by mouth daily.   rosuvastatin (CRESTOR) 10 MG tablet Take 10 mg by mouth daily.     Allergies:   Other   Social History   Socioeconomic History   Marital status: Legally  Separated    Spouse name: Not on file   Number of children: 3   Years of education: Not on file   Highest education level: Not on file  Occupational History   Not on file  Tobacco Use   Smoking status: Every Day   Smokeless tobacco: Never   Tobacco comments:    Down to 3-4 ciggs a day (on Chantix)  Substance and Sexual Activity   Alcohol use: No   Drug use: Yes    Frequency: 2.0 times per week    Types: Marijuana    Comment: occas   Sexual activity: Not on file  Other Topics Concern   Not on file  Social History Narrative   Not on file   Social Determinants of Health   Financial Resource Strain: Not on file  Food Insecurity: Not on file  Transportation Needs: Not on file  Physical Activity: Not on file  Stress: Not on file  Social Connections: Not on file     Family History: The patient's family history includes Alcoholism in her paternal uncle; COPD in her paternal grandmother; Cancer in her maternal grandmother; Cirrhosis in her paternal uncle; Colon polyps in her paternal grandfather and paternal uncle; Emphysema in her paternal grandmother; Healthy in her daughter, daughter, and son; Lymphoma in her paternal grandfather. There is  no history of Colon cancer.  ROS:   Please see the history of present illness.     All other systems reviewed and are negative.  EKGs/Labs/Other Studies Reviewed:    The following studies were reviewed today:   EKG:   03/27/21: Normal sinus rhythm, rate 70, no ST abnormalities  Recent Labs: No results found for requested labs within last 8760 hours.  Recent Lipid Panel No results found for: CHOL, TRIG, HDL, CHOLHDL, VLDL, LDLCALC, LDLDIRECT  Physical Exam:    VS:  BP 138/86    Pulse 70    Ht 5\' 3"  (1.6 m)    Wt 180 lb 6.4 oz (81.8 kg)    SpO2 98%    BMI 31.96 kg/m     Wt Readings from Last 3 Encounters:  03/27/21 180 lb 6.4 oz (81.8 kg)  07/02/19 170 lb (77.1 kg)  05/13/19 168 lb 12.8 oz (76.6 kg)     GEN:  Well nourished,  well developed in no acute distress HEENT: Normal NECK: No JVD; No carotid bruits LYMPHATICS: No lymphadenopathy CARDIAC: RRR, no murmurs, rubs, gallops RESPIRATORY:  Clear to auscultation without rales, wheezing or rhonchi  ABDOMEN: Soft, non-tender, non-distended MUSCULOSKELETAL:  No edema; No deformity  SKIN: Warm and dry NEUROLOGIC:  Alert and oriented x 3 PSYCHIATRIC:  Normal affect   ASSESSMENT:    1. Precordial pain   2. Medication management   3. Hyperlipidemia, unspecified hyperlipidemia type    PLAN:    Chest pain: Description suggest possible angina, as can be brought on by exertion/stress.  Despite her age, she does have CAD risk factors (lupus, hyperlipidemia, family history, tobacco use).   -Recommend coronary CTA to evaluate for obstructive CAD.  Will give metoprolol 50 mg prior to study -Echocardiogram to rule out structural heart disease, particularly given her history of lupus  Hyperlipidemia: LDL 146 on 12/20/20, started rosuvastatin 10 mg daily in January 2023  RTC in 6 months  Medication Adjustments/Labs and Tests Ordered: Current medicines are reviewed at length with the patient today.  Concerns regarding medicines are outlined above.  Orders Placed This Encounter  Procedures   CT CORONARY MORPH W/CTA COR W/SCORE W/CA W/CM &/OR WO/CM   Basic Metabolic Panel (BMET)   EKG 12-Lead   ECHOCARDIOGRAM COMPLETE   Meds ordered this encounter  Medications   metoprolol tartrate (LOPRESSOR) 50 MG tablet    Sig: Take 2 hours before CT scan    Dispense:  1 tablet    Refill:  0    Patient Instructions  Medication Instructions:  Your physician recommends that you continue on your current medications as directed. Please refer to the Current Medication list given to you today.  *If you need a refill on your cardiac medications before your next appointment, please call your pharmacy*   Lab Work: Your physician recommends that you return for lab work in:  TODAY:  BMET If you have labs (blood work) drawn today and your tests are completely normal, you will receive your results only by: [place] (if you have [place]) OR A paper copy in the mail If you have any lab test that is abnormal or we need to change your treatment, we will call you to review the results.   Testing/Procedures: Your physician has requested that you have an echocardiogram. Echocardiography is a painless test that uses sound waves to create images of your heart. It provides your doctor with information about the size and shape of your heart and how well  your hearts chambers and valves are working. This procedure takes approximately one hour. There are no restrictions for this procedure.    Your cardiac CT will be scheduled at one of the below locations:   [facility] [address] [place], [place] [address] [phone]   If scheduled at [facility], please arrive at the [facility] main entrance (entrance A) of [facility] 30 minutes prior to test start time. You can use the FREE valet parking offered at the main entrance (encouraged to control the heart rate for the test) Proceed to the [facility] Radiology Department (first floor) to check-in and test prep.   Please follow these instructions carefully (unless otherwise directed):    On the Night Before the Test: Be sure to Drink plenty of water. Do not consume any caffeinated/decaffeinated beverages or chocolate 12 hours prior to your test. Do not take any antihistamines 12 hours prior to your test.  On the Day of the Test: Drink plenty of water until 1 hour prior to the test. Do not eat any food 4 hours prior to the test. You may take your regular medications prior to the test.  Take metoprolol (Lopressor) two hours prior to test. FEMALES- please wear underwire-free bra if available, avoid dresses & tight clothing        After the Test: Drink plenty of water. After  receiving IV contrast, you may experience a mild flushed feeling. This is normal. On occasion, you may experience a mild rash up to 24 hours after the test. This is not dangerous. If this occurs, you can take Benadryl 25 mg and increase your fluid intake. If you experience trouble breathing, this can be serious. If it is severe call 911 IMMEDIATELY. If it is mild, please call our office. If you take any of these medications: Glipizide/Metformin, Avandament, Glucavance, please do not take 48 hours after completing test unless otherwise instructed.  We will call to schedule your test 2-4 weeks out understanding that some insurance companies will need an authorization prior to the service being performed.   For non-scheduling related questions, please contact the cardiac imaging nurse navigator should you have any questions/concerns: Marchia Bond, Cardiac Imaging Nurse Navigator Gordy Clement, Cardiac Imaging Nurse Navigator [place] [facility] Direct Office Dial: [phone]   For scheduling needs, including cancellations and rescheduling, please call [place], [phone].    Follow-Up: At [facility], you and your health needs are our priority.  As part of our continuing mission to provide you with exceptional heart care, we have created designated Provider Care Teams.  These Care Teams include your primary Cardiologist (physician) and Advanced Practice Providers (APPs -  Physician Assistants and Nurse Practitioners) who all work together to provide you with the care you need, when you need it.  We recommend signing up for the patient portal called "MyChart".  Sign up information is provided on this After Visit Summary.  MyChart is used to connect with patients for Virtual Visits (Telemedicine).  Patients are able to view lab/test results, encounter notes, upcoming appointments, etc.  Non-urgent messages can be sent to your provider as well.   To learn more about what  you can do with MyChart, go to [email or web address].    Your next appointment:   6 month(s)  The format for your next appointment:   In Person  Provider:   Donato Heinz, MD     Other Instructions     Signed, Doreatha Martin  Gardiner Rhyme, MD  03/27/2021 11:44 AM    [place]

## 2021-03-27 ENCOUNTER — Other Ambulatory Visit: Payer: Self-pay

## 2021-03-27 ENCOUNTER — Encounter: Payer: Self-pay | Admitting: Cardiology

## 2021-03-27 ENCOUNTER — Ambulatory Visit: Payer: BC Managed Care – PPO | Admitting: Cardiology

## 2021-03-27 VITALS — BP 138/86 | HR 70 | Ht 63.0 in | Wt 180.4 lb

## 2021-03-27 DIAGNOSIS — Z79899 Other long term (current) drug therapy: Secondary | ICD-10-CM

## 2021-03-27 DIAGNOSIS — R072 Precordial pain: Secondary | ICD-10-CM | POA: Diagnosis not present

## 2021-03-27 DIAGNOSIS — E785 Hyperlipidemia, unspecified: Secondary | ICD-10-CM

## 2021-03-27 MED ORDER — METOPROLOL TARTRATE 50 MG PO TABS: ORAL_TABLET | ORAL | 0 refills | Status: AC

## 2021-03-27 NOTE — Patient Instructions (Addendum)
Medication Instructions:  Your physician recommends that you continue on your current medications as directed. Please refer to the Current Medication list given to you today.  *If you need a refill on your cardiac medications before your next appointment, please call your pharmacy*   Lab Work: Your physician recommends that you return for lab work in:  TODAY: BMET If you have labs (blood work) drawn today and your tests are completely normal, you will receive your results only by: [place] (if you have [place]) OR A paper copy in the mail If you have any lab test that is abnormal or we need to change your treatment, we will call you to review the results.   Testing/Procedures: Your physician has requested that you have an echocardiogram. Echocardiography is a painless test that uses sound waves to create images of your heart. It provides your doctor with information about the size and shape of your heart and how well your hearts chambers and valves are working. This procedure takes approximately one hour. There are no restrictions for this procedure.    Your cardiac CT will be scheduled at one of the below locations:   [facility] [address] [place], [place] [address] [phone]   If scheduled at [facility], please arrive at the [facility] main entrance (entrance A) of [facility] 30 minutes prior to test start time. You can use the FREE valet parking offered at the main entrance (encouraged to control the heart rate for the test) Proceed to the [facility] Radiology Department (first floor) to check-in and test prep.   Please follow these instructions carefully (unless otherwise directed):    On the Night Before the Test: Be sure to Drink plenty of water. Do not consume any caffeinated/decaffeinated beverages or chocolate 12 hours prior to your test. Do not take any antihistamines 12 hours prior to your test.  On the Day of the  Test: Drink plenty of water until 1 hour prior to the test. Do not eat any food 4 hours prior to the test. You may take your regular medications prior to the test.  Take metoprolol (Lopressor) two hours prior to test. FEMALES- please wear underwire-free bra if available, avoid dresses & tight clothing        After the Test: Drink plenty of water. After receiving IV contrast, you may experience a mild flushed feeling. This is normal. On occasion, you may experience a mild rash up to 24 hours after the test. This is not dangerous. If this occurs, you can take Benadryl 25 mg and increase your fluid intake. If you experience trouble breathing, this can be serious. If it is severe call 911 IMMEDIATELY. If it is mild, please call our office. If you take any of these medications: Glipizide/Metformin, Avandament, Glucavance, please do not take 48 hours after completing test unless otherwise instructed.  We will call to schedule your test 2-4 weeks out understanding that some insurance companies will need an authorization prior to the service being performed.   For non-scheduling related questions, please contact the cardiac imaging nurse navigator should you have any questions/concerns: Marchia Bond, Cardiac Imaging Nurse Navigator Gordy Clement, Cardiac Imaging Nurse Navigator [place] [facility] Direct Office Dial: [phone]   For scheduling needs, including cancellations and rescheduling, please call [place], [phone].    Follow-Up: At [facility], you and your health needs are our priority.  As part of our continuing mission to provide you with  exceptional heart care, we have created designated Provider Care Teams.  These Care Teams include your primary Cardiologist (physician) and Advanced Practice Providers (APPs -  Physician Assistants and Nurse Practitioners) who all work together to provide you with the care you need, when you need it.  We recommend  signing up for the patient portal called "MyChart".  Sign up information is provided on this After Visit Summary.  MyChart is used to connect with patients for Virtual Visits (Telemedicine).  Patients are able to view lab/test results, encounter notes, upcoming appointments, etc.  Non-urgent messages can be sent to your provider as well.   To learn more about what you can do with MyChart, go to [email or web address].    Your next appointment:   6 month(s)  The format for your next appointment:   In Person  Provider:   Donato Heinz, MD     Other Instructions

## 2021-03-28 LAB — BASIC METABOLIC PANEL
BUN/Creatinine Ratio: 16 (ref 9–23)
BUN: 12 mg/dL (ref 6–24)
CO2: 24 mmol/L (ref 20–29)
Calcium: 9.8 mg/dL (ref 8.7–10.2)
Chloride: 105 mmol/L (ref 96–106)
Creatinine, Ser: 0.73 mg/dL (ref 0.57–1.00)
Glucose: 81 mg/dL (ref 70–99)
Potassium: 4.3 mmol/L (ref 3.5–5.2)
Sodium: 139 mmol/L (ref 134–144)
eGFR: 105 mL/min/{1.73_m2} (ref >59)

## 2021-04-03 ENCOUNTER — Ambulatory Visit (HOSPITAL_COMMUNITY): Payer: BC Managed Care – PPO | Attending: Cardiovascular Disease

## 2021-04-03 ENCOUNTER — Other Ambulatory Visit: Payer: Self-pay

## 2021-04-03 DIAGNOSIS — R072 Precordial pain: Secondary | ICD-10-CM | POA: Insufficient documentation

## 2021-04-03 LAB — ECHOCARDIOGRAM COMPLETE
Area-P 1/2: 4.02 cm2
S' Lateral: 2.7 cm

## 2021-04-06 ENCOUNTER — Telehealth (HOSPITAL_COMMUNITY): Payer: Self-pay | Admitting: *Deleted

## 2021-04-06 NOTE — Telephone Encounter (Signed)
Reaching out to patient to offer assistance regarding upcoming cardiac imaging study; pt verbalizes understanding of appt date/time, parking situation and where to check in, pre-test NPO status and medications ordered, and verified current allergies; name and call back number provided for further questions should they arise  Gordy Clement RN Navigator Cardiac Imaging [facility] [phone] office [phone] cell  Patient to take 50mg  metoprolol tartrate two hours prior to her cardiac CT scan.  She is aware to arrive at 9am for her 9:30am scan.

## 2021-04-09 ENCOUNTER — Encounter (HOSPITAL_COMMUNITY): Payer: Self-pay

## 2021-04-09 ENCOUNTER — Other Ambulatory Visit: Payer: Self-pay

## 2021-04-09 ENCOUNTER — Ambulatory Visit (HOSPITAL_COMMUNITY)
Admission: RE | Admit: 2021-04-09 | Discharge: 2021-04-09 | Disposition: A | Discharge status: Home / Self Care | Payer: BC Managed Care – PPO | Source: Ambulatory Visit | Attending: Cardiology | Admitting: Cardiology

## 2021-04-09 DIAGNOSIS — R072 Precordial pain: Secondary | ICD-10-CM | POA: Insufficient documentation

## 2021-04-09 IMAGING — CT CT HEART MORP W/ CTA COR W/ SCORE W/ CA W/CM &/OR W/O CM
4 of 7 series · 8 of 20 positions shown, 9 images · IV contrast (APPLIED)
Comparison: None.
COMPARISON: None.

Addendum:
EXAM:
OVER-READ INTERPRETATION  CT CHEST

The following report is an over-read performed by radiologist Dr.
SUTHERLAND [REDACTED] on [DATE]. This
over-read does not include interpretation of cardiac or coronary
anatomy or pathology. The coronary CTA interpretation by the
cardiologist is attached.
CLINICAL DATA: 40 Year-old white female
Cardiac/Coronary CTA
TECHNIQUE: The patient was scanned on a Phillips Force scanner.

[Series 6: best diast · axial · 0.39mm/px · z∈[+1044,+1081]mm · 2 of 278 slices shown, 3 images]
[im 93/278  vessel]
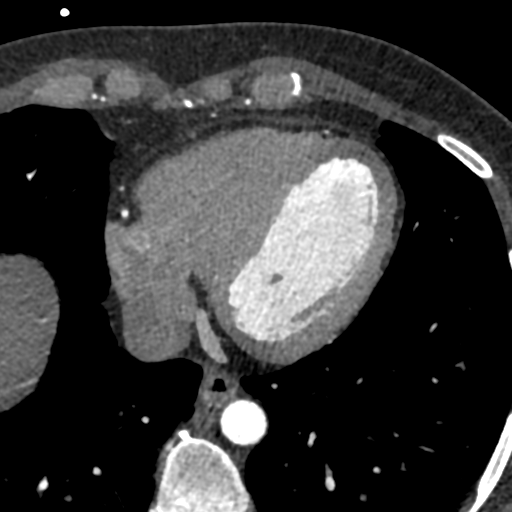
[im 93/278  lung]
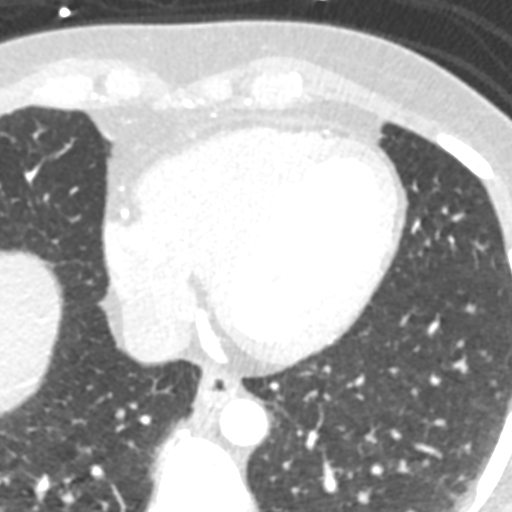
[im 185/278  vessel]
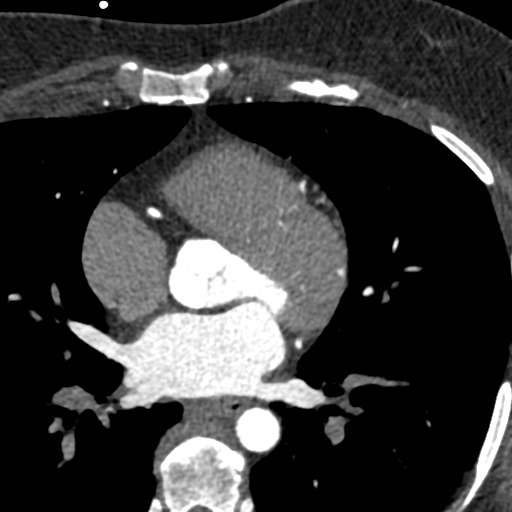

[Series 7: best syst · axial · 0.39mm/px · z∈[+1044,+1081]mm · 2 of 278 slices shown]
[im 93/278  vessel]
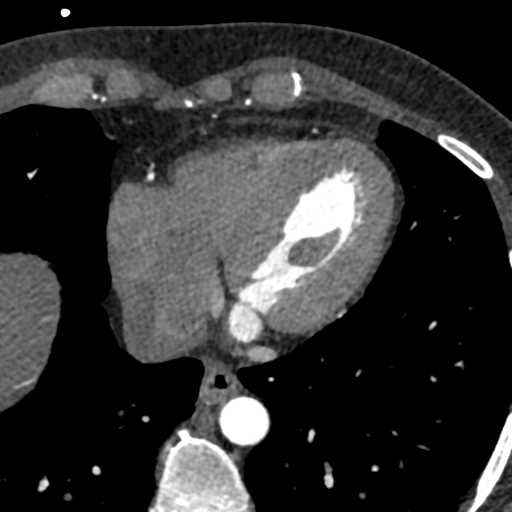
[im 185/278  vessel]
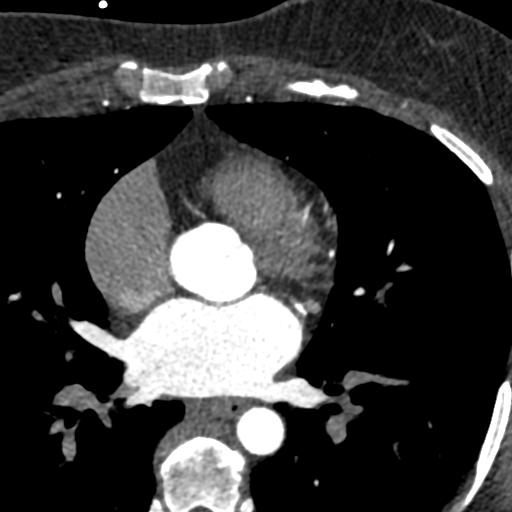

[Series 8: ts diast sharp · axial · 0.39mm/px · z∈[+1044,+1081]mm · 2 of 278 slices shown]
[im 93/278  lung]
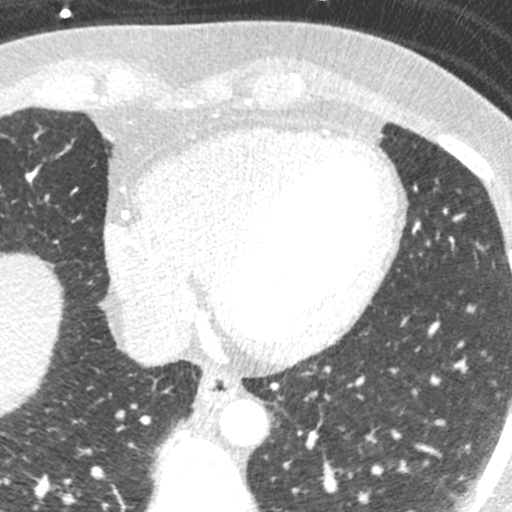
[im 185/278  lung]
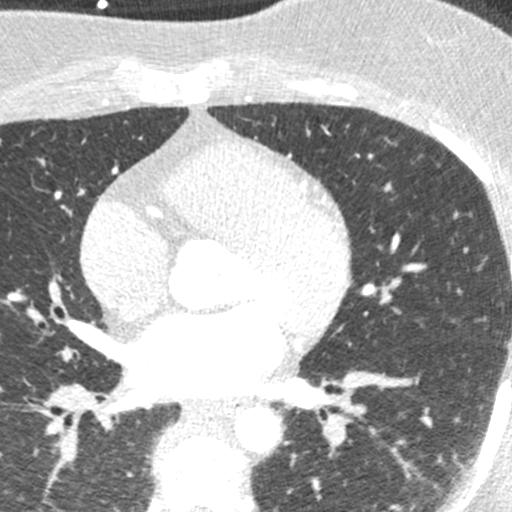

[Series 9: ts syst sharp · axial · 0.39mm/px · z∈[+1044,+1081]mm · 2 of 278 slices shown]
[im 93/278  lung]
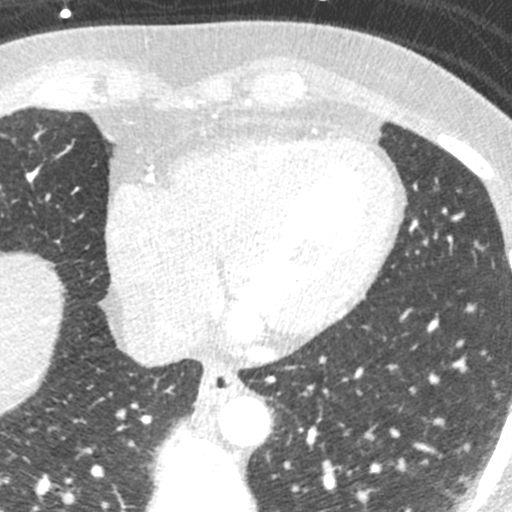
[im 185/278  lung]
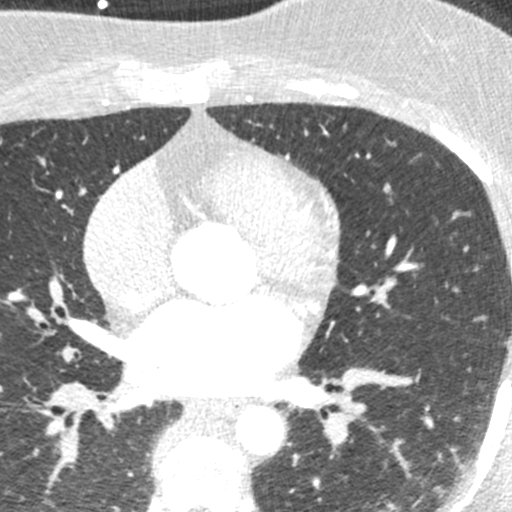

[8 of 20 positions shown; findings below may reference images not displayed]

FINDINGS: Vascular: No significant noncardiac vascular findings.

Mediastinum/Nodes: Visualized mediastinum and hilar regions
demonstrate no lymphadenopathy or masses.

Lungs/Pleura: Visualized lungs show no evidence of pulmonary edema,
consolidation, pneumothorax, nodule or pleural fluid.

Upper Abdomen: No acute abnormality.

Musculoskeletal: No chest wall mass or suspicious bone lesions
identified.
IMPRESSION: No significant incidental findings.
FINDINGS: Scan was triggered in the descending thoracic aorta. Axial
non-contrast 3 mm slices were carried out through the heart. The
data set was analyzed on a dedicated work station and scored using
the Agatson method. Gantry rotation speed was 250 msecs and
collimation was .6 mm. 0.8 mg of sl NTG was given. The 3D data set
was reconstructed in 5% intervals of the 67-82 % of the R-R cycle.
Diastolic phases were analyzed on a dedicated work station using
MPR, MIP and VRT modes. The patient received 95 cc of contrast.

Aorta:  Normal size.  No calcifications.  No dissection.

Main Pulmonary Artery: Normal size of the pulmonary artery.

Aortic Valve:  Tri-leaflet.  No calcifications.

Coronary Arteries:  Normal coronary origin.  Right dominance.

Coronary Calcium Score:

Left main: 0

Left anterior descending artery: 0

Left circumflex artery: 0

Right coronary artery: 0

Total: 0

Percentile: 1st for age, sex, and race matched control.

RCA is a large dominant artery that gives rise to PDA and PLA. There
is no significant plaque.

Left main is a large artery that gives rise to LAD and LCX arteries.
There is no significant plaque.

LAD is a large vessel that gives rise to two diagonal vessels. There
is no significant plaque.

LCX is a non-dominant artery that gives rise to one small OM vessel.
There is no significant plaque.

Other findings:

Normal pulmonary vein drainage into the left atrium.

Normal left atrial appendage without a thrombus.

Extra-cardiac findings: See attached radiology report for
non-cardiac structures.

Cardiac motion artifact.
IMPRESSION: 1. Coronary calcium score of 0. This was 1st percentile for age,
sex, and race matched control.

2. Normal coronary origin with right dominance.

3. CAD-RADS 0. No evidence of CAD (0%). Consider non-atherosclerotic
causes of chest pain.

RECOMMENDATIONS:



If CAC = 0, it is reasonable to withhold statin therapy and reassess
in 5 to 10 years, as long as higher risk conditions are absent
(diabetes mellitus, family history of premature CHD in first degree
relatives (males <55 years; females <65 years), cigarette smoking,
LDL >=190 mg/dL or other independent risk factors).

If CAC is 1 to 99, it is reasonable to initiate statin therapy for
patients >=55 years of age.

If CAC is >=100 or >=75th percentile, it is reasonable to initiate
statin therapy at any age.

Cardiology referral should be considered for patients with CAC
scores =400 or >=75th percentile.

*[WD] AHA/ACC/AACVPR/AAPA/ABC/SUTHERLAND/SUTHERLAND/SUTHERLAND/SUTHERLAND/SUTHERLAND/SUTHERLAND/SUTHERLAND
Guideline on the Management of Blood Cholesterol: A Report of the
American College of Cardiology/American Heart Association Task Force
on Clinical Practice Guidelines. J Am Coll Cardiol.
[WD];73(24):[PHONE_NUMBER].

*** End of Addendum ***
EXAM:
OVER-READ INTERPRETATION  CT CHEST

The following report is an over-read performed by radiologist Dr.
SUTHERLAND [REDACTED] on [DATE]. This
over-read does not include interpretation of cardiac or coronary
anatomy or pathology. The coronary CTA interpretation by the
cardiologist is attached.
FINDINGS: Vascular: No significant noncardiac vascular findings.

Mediastinum/Nodes: Visualized mediastinum and hilar regions
demonstrate no lymphadenopathy or masses.

Lungs/Pleura: Visualized lungs show no evidence of pulmonary edema,
consolidation, pneumothorax, nodule or pleural fluid.

Upper Abdomen: No acute abnormality.

Musculoskeletal: No chest wall mass or suspicious bone lesions
identified.
IMPRESSION: No significant incidental findings.

## 2021-04-09 MED ORDER — IOHEXOL 350 MG/ML SOLN
95.0000 mL | Freq: Once | INTRAVENOUS | Status: AC | PRN
  Administered 2021-04-09: 95 mL via INTRAVENOUS

## 2021-04-09 MED ORDER — NITROGLYCERIN 0.4 MG SL SUBL
SUBLINGUAL_TABLET | SUBLINGUAL | Status: AC
  Filled 2021-04-09: qty 2

## 2021-04-09 MED ORDER — NITROGLYCERIN 0.4 MG SL SUBL
0.8000 mg | SUBLINGUAL_TABLET | Freq: Once | SUBLINGUAL | Status: AC
  Administered 2021-04-09: 0.8 mg via SUBLINGUAL

## 2021-05-27 NOTE — Progress Notes (Signed)
? ?Office Visit Note ? ?Patient: Brittney White             ?Date of Birth: [birth year]           ?MRN: [id number]             ?PCP: Cory Munch, PA-C (Inactive) ?Referring: Redmond School, MD ?Visit Date: 05/28/2021 ? ?Subjective:  ?New Patient (Initial Visit) (Lupus) ? ? ?History of Present Illness: Brittney White is a 41 y.o. female here for evaluation and management of lupus based on chronic leukocytosis, joint pains, positive ANA. She has some chronic joint pain but problems are mostly in the past 2 years. Worst area affected in her low back with right hip and knee pain. Pain is worse after being stationary and improves with movement. She has improvement with a series of local injections but was recommended by her PCP that she needed more definitive treatment versus indefinite repeat procedures. She had limited improvement to meloxicam. She is anxious about medication dependency with substance abuse family history. She quit smoking since 2 years ago with chantix as pharmacological aid and has remained off with no relapse.  Besides the joint pains she does feel chronic fatigue.  Denies temperature dependent discoloration in fingers and toes, abnormal bleeding or blood clots, no mouth or nasal ulcers. ? ? ?Activities of Daily Living:  ?Patient reports morning stiffness for 45 minutes.   ?Patient Reports nocturnal pain.  ?Difficulty dressing/grooming: Denies ?Difficulty climbing stairs: Reports ?Difficulty getting out of chair: Reports ?Difficulty using hands for taps, buttons, cutlery, and/or writing: Denies ? ?Review of Systems  ?Constitutional:  Positive for fatigue.  ?HENT:  Negative for mouth dryness.   ?Eyes:  Negative for dryness.  ?Respiratory:  Positive for shortness of breath.   ?Cardiovascular:  Positive for swelling in legs/feet.  ?Gastrointestinal:  Positive for constipation and diarrhea.  ?Endocrine: Negative for excessive thirst.  ?Genitourinary:  Negative for difficulty urinating.  ?Musculoskeletal:   Positive for joint pain, joint pain, joint swelling, morning stiffness and muscle tenderness.  ?Skin:  Positive for redness.  ?Allergic/Immunologic: Negative for susceptible to infections.  ?Neurological:  Negative for numbness.  ?Hematological:  Negative for bruising/bleeding tendency.  ?Psychiatric/Behavioral:  Negative for sleep disturbance.   ? ?PMFS History:  ?Patient Active Problem List  ? Diagnosis Date Noted  ? Pain in right hip 05/28/2021  ? Low back pain 05/28/2021  ? Positive ANA (antinuclear antibody) 05/28/2021  ? Bilateral hand pain 05/28/2021  ? Dental caries 05/28/2021  ? Rectal bleeding 05/13/2019  ? Leukocytosis 04/06/2019  ?  ?Past Medical History:  ?Diagnosis Date  ? Baby born premature   ? Lupus ([place])   ?  ?Family History  ?Problem Relation Age of Onset  ? Drug abuse Mother   ? Heart attack Father   ? Cirrhosis Sister   ? Alcoholism Paternal Uncle   ? Cirrhosis Paternal Uncle   ? Colon polyps Paternal Uncle   ? Cancer Maternal Grandmother   ? COPD Paternal Grandmother   ? Emphysema Paternal Grandmother   ? Lymphoma Paternal Grandfather   ? Colon polyps Paternal Grandfather   ? Healthy Daughter   ? Healthy Daughter   ? Healthy Son   ? Colon cancer Neg Hx   ? ?Past Surgical History:  ?Procedure Laterality Date  ? COLONOSCOPY N/A 07/02/2019  ? one 4 mm polyp in descending colon, one 10 mm polyp in sigmoid, internal hemorrhoids. Tubular adenomas. 5 year surveillance.   ? ear drum surgery    ?  LUNG SURGERY    ? POLYPECTOMY  07/02/2019  ? Procedure: POLYPECTOMY;  Surgeon: Daneil Dolin, MD;  Location: [facility];  Service: Endoscopy;;  ? reconstructive surgery to ears    ? ?Social History  ? ?Social History Narrative  ? Not on file  ? ? ?There is no immunization history on file for this patient.  ? ?Objective: ?Vital Signs: BP 135/83 (BP Location: Left Arm, Patient Position: Sitting, Cuff Size: Normal)   Pulse 76   Resp 15   Ht '5\' 3"'$  (1.6 m)   Wt 182 lb (82.6 kg)   BMI 32.24 kg/m?    ? ?Physical Exam ?HENT:  ?   Right Ear: External ear normal.  ?   Left Ear: External ear normal.  ?   Mouth/Throat:  ?   Comments: Multiple advanced dental caries ?No ulcers or sores ?Eyes:  ?   Conjunctiva/sclera: Conjunctivae normal.  ?   Comments: Mild bilateral pingueculae  ?Cardiovascular:  ?   Rate and Rhythm: Normal rate and regular rhythm.  ?Pulmonary:  ?   Effort: Pulmonary effort is normal.  ?   Breath sounds: Normal breath sounds.  ?Musculoskeletal:  ?   Right lower leg: No edema.  ?   Left lower leg: No edema.  ?Lymphadenopathy:  ?   Cervical: No cervical adenopathy.  ?Skin: ?   General: Skin is warm and dry.  ?   Findings: No rash.  ?Neurological:  ?   General: No focal deficit present.  ?   Mental Status: She is alert.  ?Psychiatric:     ?   Mood and Affect: Mood normal.  ?  ? ?Musculoskeletal Exam:  ?Shoulders full ROM stiffness overhead abduction but no tenderness to palpation ?Elbows full ROM no tenderness or swelling ?Wrists full ROM no tenderness or swelling ?Fingers full ROM tenderness in DIP joints, mild erythema small peripheral nodules present, no palpable synovitis ?Right sided lumbar paraspinal muscle tenderness to palpation ?Right lateral hip mild tenderness to palpation, more pain provoked with internal rotation and FADIR maneuver ?Right knee pain with extension improved with flexion, tenderness along superior and lateral patella, pain radiating up lateral thigh ?Ankles full ROM no tenderness or swelling ? ? ?Investigation: ?No additional findings. ? ?Imaging: ?XR HIP UNILAT W OR W/O PELVIS 2-3 VIEWS RIGHT ? ?Result Date: 06/15/2021 ?X-ray pelvis and right hip Femoral acetabular joint space looks preserved.  There is slight increased sclerosis and subchondral cyst.  Not significantly increased as compared to left hip.  SI joints with mild degree of sclerosis on lateral joint border patent with no widening or erosions. Impression No significant hip arthritis changes seen ? ?XR Lumbar Spine  2-3 Views ? ?Result Date: 06/15/2021 ?X-ray lumbar spine 2 views There is some mild levoscoliosis present otherwise normal-appearing curvature.  There are some anterior osteophyte formation most prominent superior borders of the L3 and L4.  Minimal spondylolisthesis but osteophytes and joint angles could be consistent with possible neural foraminal impingement.  No acute bony abnormality seen.  ? ?Recent Labs: ?Lab Results  ?Component Value Date  ? WBC 14.7 (H) 04/06/2019  ? HGB 15.1 (H) 04/06/2019  ? PLT 297 04/06/2019  ? NA 139 03/27/2021  ? K 4.3 03/27/2021  ? CL 105 03/27/2021  ? CO2 24 03/27/2021  ? GLUCOSE 81 03/27/2021  ? BUN 12 03/27/2021  ? CREATININE 0.73 03/27/2021  ? CALCIUM 9.8 03/27/2021  ? GFRAA >60 05/03/2015  ? ? ?Speciality Comments: No specialty comments available. ? ?  Procedures:  ?No procedures performed ?Allergies: Other  ? ?Assessment / Plan:     ?Visit Diagnoses: Positive ANA (antinuclear antibody) - Plan: Anti-DNA antibody, double-stranded, Anti-Smith antibody, C3 and C4, Protein / creatinine ratio, urine ? ?Multiple clinical symptoms most prominently with the joint pain and fatigue although nonspecific for clinical criteria.  We will check specific antibody markers for additional work-up of positive ANA.  Also checking urine for protein creatinine ratio.  If labs are normal would recommend trial of hydroxychloroquine for treatment could also consider in setting of seropositive disease but with no active synovitis and normal labs cannot confirm to be coming from inflammatory arthritis. ? ?Pain in right hip  ?Chronic right-sided low back pain, unspecified whether sciatica present - Plan: XR HIP UNILAT W OR W/O PELVIS 2-3 VIEWS RIGHT, XR Lumbar Spine 2-3 Views, cyclobenzaprine (FEXMID) 7.5 MG tablet ? ?Right hip pain with lateral tenderness but also gets some anterior pain provoked with internal rotation movement could be consistent with articular disease.  Check x-rays of right hip and lumbar  spine in clinic does not show much abnormality in the right hip there are degenerative changes in the low back could have some radiculopathy related to this as well.  Recommend trial of low-dose Flexeril for low back p

## 2021-05-28 ENCOUNTER — Ambulatory Visit (INDEPENDENT_AMBULATORY_CARE_PROVIDER_SITE_OTHER): Payer: BC Managed Care – PPO

## 2021-05-28 ENCOUNTER — Encounter: Payer: Self-pay | Admitting: Internal Medicine

## 2021-05-28 ENCOUNTER — Ambulatory Visit (INDEPENDENT_AMBULATORY_CARE_PROVIDER_SITE_OTHER): Payer: BC Managed Care – PPO | Admitting: Internal Medicine

## 2021-05-28 VITALS — BP 135/83 | HR 76 | Resp 15 | Ht 63.0 in | Wt 182.0 lb

## 2021-05-28 DIAGNOSIS — M79641 Pain in right hand: Secondary | ICD-10-CM

## 2021-05-28 DIAGNOSIS — M545 Low back pain, unspecified: Secondary | ICD-10-CM | POA: Diagnosis not present

## 2021-05-28 DIAGNOSIS — M25551 Pain in right hip: Secondary | ICD-10-CM

## 2021-05-28 DIAGNOSIS — G8929 Other chronic pain: Secondary | ICD-10-CM

## 2021-05-28 DIAGNOSIS — K029 Dental caries, unspecified: Secondary | ICD-10-CM

## 2021-05-28 DIAGNOSIS — D72829 Elevated white blood cell count, unspecified: Secondary | ICD-10-CM

## 2021-05-28 DIAGNOSIS — R768 Other specified abnormal immunological findings in serum: Secondary | ICD-10-CM | POA: Diagnosis not present

## 2021-05-28 DIAGNOSIS — M79642 Pain in left hand: Secondary | ICD-10-CM

## 2021-05-28 MED ORDER — CYCLOBENZAPRINE HCL 7.5 MG PO TABS: 7.5000 mg | ORAL_TABLET | Freq: Every evening | ORAL | 0 refills | Status: AC | PRN

## 2021-05-29 LAB — C3 AND C4
C3 Complement: 120 mg/dL (ref 83–193)
C4 Complement: 26 mg/dL (ref 15–57)

## 2021-05-29 LAB — PROTEIN / CREATININE RATIO, URINE
Creatinine, Urine: 80 mg/dL (ref 20–275)
Protein/Creat Ratio: 125 mg/g creat (ref 24–184)
Protein/Creatinine Ratio: 0.125 mg/mg creat (ref 0.024–0.184)
Total Protein, Urine: 10 mg/dL (ref 5–24)

## 2021-05-29 LAB — ANTI-DNA ANTIBODY, DOUBLE-STRANDED: ds DNA Ab: 1 IU/mL

## 2021-05-29 LAB — ANTI-SMITH ANTIBODY: ENA SM Ab Ser-aCnc: <1 AI

## 2021-06-15 NOTE — Progress Notes (Signed)
Lab results are all negative for more specific markers of lupus. She has some degenerative changes in the low back look like chronic use or wear and tear related issue. I don't think she needs to follow up unless having new problems.

## 2021-09-24 DIAGNOSIS — K089 Disorder of teeth and supporting structures, unspecified: Secondary | ICD-10-CM | POA: Diagnosis not present

## 2021-09-24 DIAGNOSIS — K047 Periapical abscess without sinus: Secondary | ICD-10-CM | POA: Diagnosis not present

## 2021-09-24 DIAGNOSIS — K0889 Other specified disorders of teeth and supporting structures: Secondary | ICD-10-CM | POA: Diagnosis not present

## 2021-10-31 ENCOUNTER — Telehealth: Payer: Self-pay | Admitting: Internal Medicine

## 2021-10-31 NOTE — Telephone Encounter (Signed)
Candace from [facility] left a voicemail requesting office visit notes and/or test results be faxed to [phone].  Dr. Gerarda Fraction referred patient to Dr. Benjamine Mola.

## 2021-10-31 NOTE — Telephone Encounter (Signed)
Faxed office visit notes and lab results to PCP.

## 2023-07-23 ENCOUNTER — Other Ambulatory Visit: Payer: Self-pay

## 2023-07-23 ENCOUNTER — Emergency Department

## 2023-07-23 ENCOUNTER — Encounter: Payer: Self-pay | Admitting: Emergency Medicine

## 2023-07-23 ENCOUNTER — Emergency Department
Admission: EM | Admit: 2023-07-23 | Discharge: 2023-07-23 | Disposition: A | Discharge status: Home / Self Care | Attending: Emergency Medicine | Admitting: Emergency Medicine

## 2023-07-23 DIAGNOSIS — D72829 Elevated white blood cell count, unspecified: Secondary | ICD-10-CM | POA: Insufficient documentation

## 2023-07-23 DIAGNOSIS — R002 Palpitations: Secondary | ICD-10-CM | POA: Diagnosis not present

## 2023-07-23 DIAGNOSIS — R079 Chest pain, unspecified: Secondary | ICD-10-CM | POA: Diagnosis not present

## 2023-07-23 LAB — TROPONIN I (HIGH SENSITIVITY)
Troponin I (High Sensitivity): 2 ng/L (ref <18)
Troponin I (High Sensitivity): 3 ng/L (ref <18)

## 2023-07-23 LAB — CBC
HCT: 40.5 % (ref 36.0–46.0)
Hemoglobin: 13.8 g/dL (ref 12.0–15.0)
MCH: 31 pg (ref 26.0–34.0)
MCHC: 34.1 g/dL (ref 30.0–36.0)
MCV: 91 fL (ref 80.0–100.0)
Platelets: 279 10*3/uL (ref 150–400)
RBC: 4.45 MIL/uL (ref 3.87–5.11)
RDW: 13.1 % (ref 11.5–15.5)
WBC: 12.7 10*3/uL — ABNORMAL HIGH (ref 4.0–10.5)
nRBC: 0 % (ref 0.0–0.2)

## 2023-07-23 LAB — BASIC METABOLIC PANEL WITH GFR
Anion gap: 4 — ABNORMAL LOW (ref 5–15)
BUN: 19 mg/dL (ref 6–20)
CO2: 28 mmol/L (ref 22–32)
Calcium: 9.6 mg/dL (ref 8.9–10.3)
Chloride: 104 mmol/L (ref 98–111)
Creatinine, Ser: 0.68 mg/dL (ref 0.44–1.00)
GFR, Estimated: >60 mL/min (ref >60)
Glucose, Bld: 105 mg/dL — ABNORMAL HIGH (ref 70–99)
Potassium: 4.1 mmol/L (ref 3.5–5.1)
Sodium: 136 mmol/L (ref 135–145)

## 2023-07-23 LAB — POC URINE PREG, ED: Preg Test, Ur: NEGATIVE

## 2023-07-23 NOTE — ED Provider Notes (Signed)
 [facility] Provider Note    Event Date/Time   First MD Initiated Contact with Patient 07/23/23 [phone]     (approximate)   History   Palpitations   HPI  Brittney White is a 43 y.o. female past medical history significant for hyperlipidemia, who presents to the emergency department with chest palpitations.  Patient states that she started to not feel well after leaving work today.  Felt like her hands were tingling and she was having a tingling sensation around her chest.  Just felt like something was off.  Denies any significant chest pain or shortness of breath at this time.  Denies nausea, vomiting or diaphoresis.  No change that is worsening with exertion.  No history of DVT or PE.  Has a Mirena  IUD in place.  Does endorse a significant amount of stress recently and is very tearful on exam.  States that she is not compliant with her statin therapy.  No tobacco use or significant alcohol use.     Physical Exam   Triage Vital Signs: ED Triage Vitals  Encounter Vitals Group     BP 07/23/23 0833 135/83     Systolic BP Percentile --      Diastolic BP Percentile --      Pulse Rate 07/23/23 0833 70     Resp 07/23/23 0833 16     Temp 07/23/23 0833 98.2 F (36.8 C)     Temp Source 07/23/23 0833 Oral     SpO2 07/23/23 0833 99 %     Weight 07/23/23 0831 160 lb (72.6 kg)     Height 07/23/23 0831 5' 3 (1.6 m)     Head Circumference --      Peak Flow --      Pain Score 07/23/23 0830 4     Pain Loc --      Pain Education --      Exclude from Growth Chart --     Most recent vital signs: Vitals:   07/23/23 0932 07/23/23 1000  BP: 124/85 118/66  Pulse: 61 (!) 58  Resp: 15 17  Temp:    SpO2: 100% 99%    Physical Exam Constitutional:      Appearance: She is well-developed.  HENT:     Head: Atraumatic.  Eyes:     Conjunctiva/sclera: Conjunctivae normal.  Cardiovascular:     Rate and Rhythm: Regular rhythm.  Pulmonary:     Effort: No respiratory  distress.  Abdominal:     General: There is no distension.  Musculoskeletal:        General: Normal range of motion.     Cervical back: Normal range of motion.     Right lower leg: No edema.     Left lower leg: No edema.     Comments: No unilateral leg swelling  Skin:    General: Skin is warm.     Capillary Refill: Capillary refill takes less than 2 seconds.  Neurological:     Mental Status: She is alert. Mental status is at baseline.  Psychiatric:     Comments: Tearful and anxious     IMPRESSION / MDM / ASSESSMENT AND PLAN / ED COURSE  I reviewed the triage vital signs and the nursing notes.  Differential diagnosis including anemia, electrolyte abnormality, ACS, pneumonia, pneumothorax, stress  EKG  I, Viviano Ground, the attending physician, personally viewed and interpreted this ECG.   Rate: Normal  Rhythm: Normal sinus  Axis: Normal  Intervals: Normal  ST&T Change: None  No tachycardic or bradycardic dysrhythmias while on cardiac telemetry.  RADIOLOGY I independently reviewed imaging, my interpretation of imaging: Chest x-ray no signs of pneumonia  LABS (all labs ordered are listed, but only abnormal results are displayed) Labs interpreted as -    Labs Reviewed  BASIC METABOLIC PANEL WITH GFR - Abnormal; Notable for the following components:      Result Value   Glucose, Bld 105 (*)    Anion gap 4 (*)    All other components within normal limits  CBC - Abnormal; Notable for the following components:   WBC 12.7 (*)    All other components within normal limits  POC URINE PREG, ED  POC URINE PREG, ED  TROPONIN I (HIGH SENSITIVITY)  TROPONIN I (HIGH SENSITIVITY)     MDM    Mild leukocytosis on exam but otherwise no symptoms concerning for an infectious process.  No symptoms of a urinary tract infection.  Abdomen is nontender.  No findings concerning for pneumonia.  Patient with no unexplained weight loss or weight changes have a low suspicion for  hyperthyroidism.  No significant electrolyte abnormalities.  Initial troponin is negative.  Low risk heart score of 1, chest pain symptoms have been ongoing more than 1 hour do not feel that serial troponins are necessary at this time.  Discussed close follow-up as an outpatient with primary care provider.  Have low suspicion for dissection or pulmonary embolism is otherwise low risk Wells criteria and PERC negative.  Chest pain-free.  Discussed close follow-up with primary care provider.  Repeat exam continues to be well-appearing with normal and symmetric pulses.  Low suspicion for dissection.  Discussed close follow-up as an outpatient and discussed return for any ongoing or worsening symptoms.  Patient expressed understanding.  No questions at time of discharge.  Discussed at length if she had any ongoing symptoms to return to the emergency department for repeat exam.   PROCEDURES:  Critical Care performed: No  Procedures  Patient's presentation is most consistent with acute presentation with potential threat to life or bodily function.   MEDICATIONS ORDERED IN ED: Medications - No data to display  FINAL CLINICAL IMPRESSION(S) / ED DIAGNOSES   Final diagnoses:  Palpitations     Rx / DC Orders   ED Discharge Orders          Ordered    Ambulatory Referral to Primary Care (Establish Care)        07/23/23 1056             Note:  This document was prepared using Dragon voice recognition software and may include unintentional dictation errors.   Viviano Ground, MD 07/23/23 1100

## 2023-07-23 NOTE — Discharge Instructions (Addendum)
 You are seen in the emergency department for chest pain and not feeling well.  Your EKG was normal.  Your electrolytes were normal.  You had a heart enzyme which was called a troponin which was also normal.  Do not believe you are having a heart attack today.  This could be from stress however you need close evaluation by your primary care physician.  If you have ongoing symptoms it is important that you return to the emergency department for reevaluation.  You are given a list of primary care providers in the area and referral was placed.  You are also given information for counselors that are in the area.  If you have ongoing symptoms you can return to the emergency department anytime for reevaluation.

## 2023-07-23 NOTE — ED Triage Notes (Signed)
 Pt via POV from home. Pt c/o palpitations, bilateral arm numbness, and SOB. Reports she had an episode last night and then again this morning. Denies cough/congestion Denies any cardiac hx. Denies fever. Pt is A&OX4 and NAD, ambulatory to triage.
